# Patient Record
Sex: Female | Born: 1965 | Race: Black or African American | Hispanic: No | Smoking: Never smoker
Health system: Southern US, Community
[De-identification: ages and names within clinical notes are randomized; demographics above are authoritative.]

## PROBLEM LIST (undated history)

## (undated) DIAGNOSIS — N760 Acute vaginitis: Secondary | ICD-10-CM

## (undated) DIAGNOSIS — D649 Anemia, unspecified: Secondary | ICD-10-CM

## (undated) DIAGNOSIS — A770 Spotted fever due to Rickettsia rickettsii: Secondary | ICD-10-CM

## (undated) DIAGNOSIS — B9689 Other specified bacterial agents as the cause of diseases classified elsewhere: Secondary | ICD-10-CM

## (undated) DIAGNOSIS — R300 Dysuria: Secondary | ICD-10-CM

## (undated) DIAGNOSIS — Z9189 Other specified personal risk factors, not elsewhere classified: Secondary | ICD-10-CM

## (undated) DIAGNOSIS — N898 Other specified noninflammatory disorders of vagina: Secondary | ICD-10-CM

## (undated) DIAGNOSIS — D219 Benign neoplasm of connective and other soft tissue, unspecified: Secondary | ICD-10-CM

## (undated) DIAGNOSIS — A6920 Lyme disease, unspecified: Secondary | ICD-10-CM

## (undated) DIAGNOSIS — F419 Anxiety disorder, unspecified: Secondary | ICD-10-CM

## (undated) HISTORY — DX: Lyme disease, unspecified: A69.20

## (undated) HISTORY — DX: Dysuria: R30.0

## (undated) HISTORY — DX: Spotted fever due to Rickettsia rickettsii: A77.0

## (undated) HISTORY — DX: Anemia, unspecified: D64.9

## (undated) HISTORY — DX: Acute vaginitis: N76.0

## (undated) HISTORY — DX: Other specified personal risk factors, not elsewhere classified: Z91.89

## (undated) HISTORY — DX: Other specified noninflammatory disorders of vagina: N89.8

## (undated) HISTORY — DX: Other specified bacterial agents as the cause of diseases classified elsewhere: B96.89

## (undated) HISTORY — PX: TUBAL LIGATION: SHX77

## (undated) HISTORY — DX: Anxiety disorder, unspecified: F41.9

## (undated) HISTORY — DX: Benign neoplasm of connective and other soft tissue, unspecified: D21.9

---

## 2001-05-09 ENCOUNTER — Other Ambulatory Visit: Admission: RE | Admit: 2001-05-09 | Discharge: 2001-05-09 | Payer: Self-pay | Admitting: Obstetrics and Gynecology

## 2004-01-24 ENCOUNTER — Emergency Department (HOSPITAL_COMMUNITY): Admission: EM | Admit: 2004-01-24 | Discharge: 2004-01-24 | Payer: Self-pay | Admitting: Emergency Medicine

## 2004-12-19 ENCOUNTER — Ambulatory Visit (HOSPITAL_COMMUNITY): Admission: RE | Admit: 2004-12-19 | Discharge: 2004-12-19 | Payer: Self-pay | Admitting: Obstetrics and Gynecology

## 2005-02-20 ENCOUNTER — Ambulatory Visit: Payer: Self-pay | Admitting: Family Medicine

## 2005-02-27 ENCOUNTER — Ambulatory Visit (HOSPITAL_COMMUNITY): Admission: RE | Admit: 2005-02-27 | Discharge: 2005-02-27 | Payer: Self-pay | Admitting: Family Medicine

## 2005-05-18 ENCOUNTER — Ambulatory Visit: Payer: Self-pay | Admitting: Family Medicine

## 2005-07-13 ENCOUNTER — Ambulatory Visit: Payer: Self-pay | Admitting: Family Medicine

## 2005-08-15 ENCOUNTER — Ambulatory Visit: Payer: Self-pay | Admitting: Family Medicine

## 2006-01-12 ENCOUNTER — Ambulatory Visit: Payer: Self-pay | Admitting: Family Medicine

## 2006-09-27 ENCOUNTER — Ambulatory Visit: Payer: Self-pay | Admitting: Family Medicine

## 2006-09-27 LAB — CONVERTED CEMR LAB
ALT: 10 units/L (ref 0–35)
Albumin: 4.1 g/dL (ref 3.5–5.2)
Basophils Absolute: 0.1 10*3/uL (ref 0.0–0.1)
Bilirubin, Direct: 0.1 mg/dL (ref 0.0–0.3)
CO2: 25 meq/L (ref 19–32)
Calcium: 8.9 mg/dL (ref 8.4–10.5)
Chloride: 102 meq/L (ref 96–112)
Eosinophils Relative: 2 % (ref 0–5)
Glucose, Bld: 79 mg/dL (ref 70–99)
HCT: 41.2 % (ref 36.0–46.0)
HDL: 56 mg/dL (ref >39)
Indirect Bilirubin: 0.5 mg/dL (ref 0.0–0.9)
Lymphocytes Relative: 48 % — ABNORMAL HIGH (ref 12–46)
Lymphs Abs: 1.8 10*3/uL (ref 0.7–3.3)
MCHC: 30.1 g/dL (ref 30.0–36.0)
MCV: 69.6 fL — ABNORMAL LOW (ref 78.0–100.0)
Monocytes Relative: 10 % (ref 3–11)
Sodium: 138 meq/L (ref 135–145)
Total Bilirubin: 0.6 mg/dL (ref 0.3–1.2)
Total Protein: 7.8 g/dL (ref 6.0–8.3)
Triglycerides: 92 mg/dL (ref <150)

## 2006-11-07 ENCOUNTER — Ambulatory Visit: Payer: Self-pay | Admitting: Family Medicine

## 2007-07-23 ENCOUNTER — Other Ambulatory Visit: Admission: RE | Admit: 2007-07-23 | Discharge: 2007-07-23 | Payer: Self-pay | Admitting: Obstetrics and Gynecology

## 2008-03-18 DIAGNOSIS — N390 Urinary tract infection, site not specified: Secondary | ICD-10-CM | POA: Insufficient documentation

## 2008-07-22 ENCOUNTER — Other Ambulatory Visit: Admission: RE | Admit: 2008-07-22 | Discharge: 2008-07-22 | Payer: Self-pay | Admitting: Obstetrics and Gynecology

## 2008-09-02 ENCOUNTER — Encounter: Payer: Self-pay | Admitting: Family Medicine

## 2008-09-04 ENCOUNTER — Encounter: Payer: Self-pay | Admitting: Family Medicine

## 2008-11-24 ENCOUNTER — Telehealth: Payer: Self-pay | Admitting: Family Medicine

## 2008-12-07 ENCOUNTER — Encounter: Payer: Self-pay | Admitting: Family Medicine

## 2008-12-09 ENCOUNTER — Telehealth: Payer: Self-pay | Admitting: Family Medicine

## 2008-12-14 LAB — CONVERTED CEMR LAB: Vit D, 25-Hydroxy: 18 ng/mL — ABNORMAL LOW (ref 30–89)

## 2008-12-28 ENCOUNTER — Ambulatory Visit: Payer: Self-pay | Admitting: Family Medicine

## 2008-12-28 DIAGNOSIS — E876 Hypokalemia: Secondary | ICD-10-CM | POA: Insufficient documentation

## 2008-12-28 DIAGNOSIS — E559 Vitamin D deficiency, unspecified: Secondary | ICD-10-CM | POA: Insufficient documentation

## 2009-01-04 ENCOUNTER — Ambulatory Visit (HOSPITAL_COMMUNITY): Admission: RE | Admit: 2009-01-04 | Discharge: 2009-01-04 | Payer: Self-pay | Admitting: Family Medicine

## 2009-01-14 ENCOUNTER — Ambulatory Visit (HOSPITAL_COMMUNITY): Admission: RE | Admit: 2009-01-14 | Discharge: 2009-01-14 | Payer: Self-pay | Admitting: Family Medicine

## 2009-08-18 IMAGING — US US BREAST*L*
1 series · 2 of 2 positions shown · non-contrast
Comparison: Baseline mammogram 01/04/2009

CLINICAL DATA: Screening callback for questioned left breast mass
on baseline mammography

DIGITAL DIAGNOSTIC  LEFT  MAMMOGRAM  WITHOUT CAD AND LEFT BREAST
ULTRASOUND:

[Series 1: unknown · 0.08mm/px · 2 of 2 slices shown]
[im 1/2]
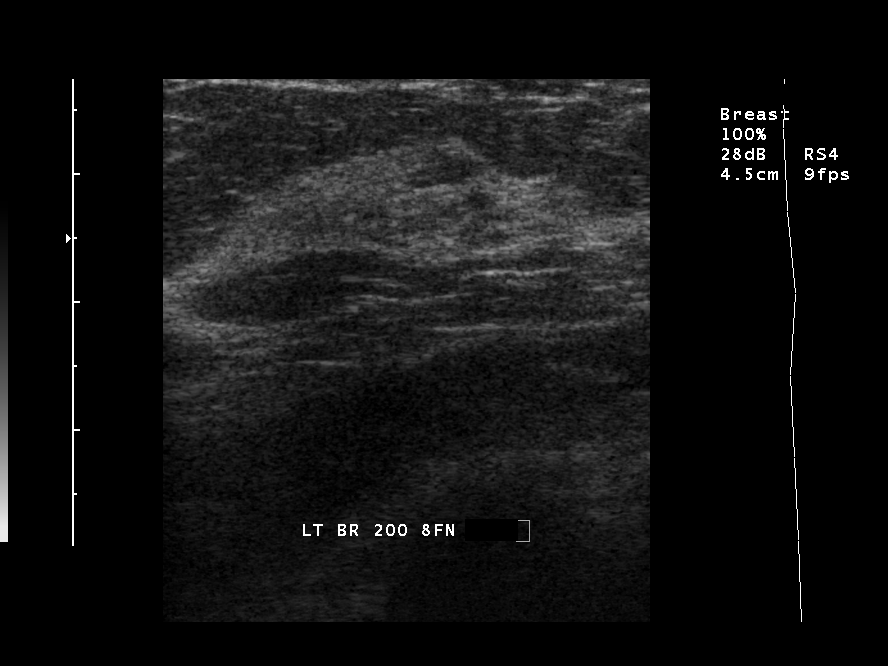
[im 2/2]
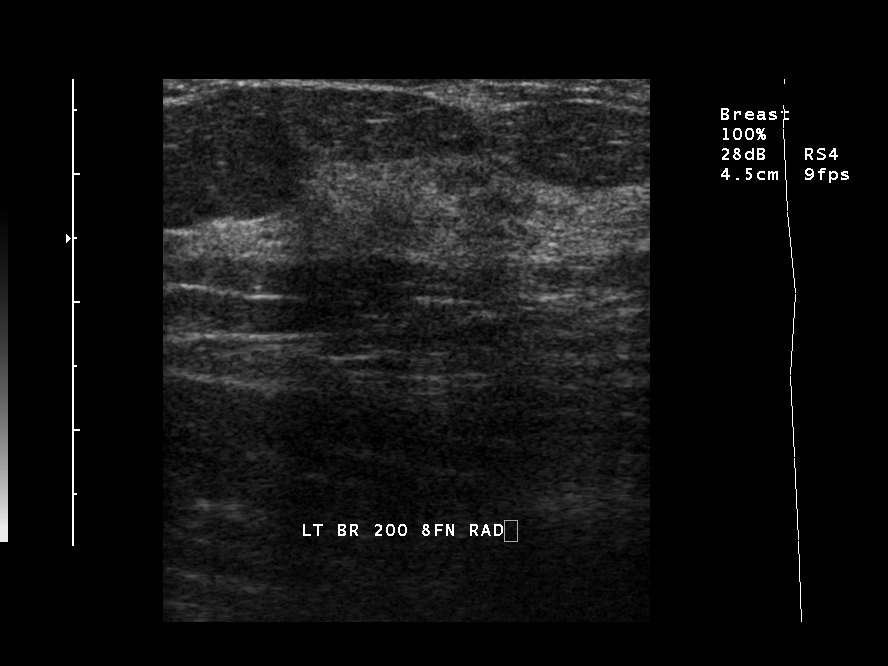

[2 of 2 positions shown; findings below may reference images not displayed]

FINDINGS: On additional views, the questioned abnormality in the
left upper outer quadrant changes conformation and demonstrates
pliability, suggestive of normal-appearing asymmetric glandular
tissue.

On physical exam, I palpate no abnormality in the left upper outer
quadrant.

Ultrasound is performed, showing an island of asymmetric normal
appearing glandular tissue in the left upper outer quadrant.  This
corresponds to the screening mammographic abnormality.
IMPRESSION: No evidence for malignancy in the left breast. Screening
mammography is recommended in one year. Findings and
recommendations discussed with the patient and provided in written
form at the time of the exam.

BI-RADS CATEGORY 1:  Negative.

Recommendation: Screening mammography is recommended in one year.

## 2010-09-19 ENCOUNTER — Other Ambulatory Visit
Admission: RE | Admit: 2010-09-19 | Discharge: 2010-09-19 | Payer: Self-pay | Source: Home / Self Care | Admitting: Obstetrics and Gynecology

## 2010-09-19 ENCOUNTER — Other Ambulatory Visit: Payer: Self-pay | Admitting: Adult Health

## 2010-09-26 ENCOUNTER — Ambulatory Visit (HOSPITAL_COMMUNITY)
Admission: RE | Admit: 2010-09-26 | Discharge: 2010-09-26 | Payer: Self-pay | Source: Home / Self Care | Attending: Obstetrics & Gynecology | Admitting: Obstetrics & Gynecology

## 2010-11-10 ENCOUNTER — Ambulatory Visit: Payer: Self-pay | Admitting: Family Medicine

## 2011-01-13 NOTE — H&P (Signed)
Dawn [name], Dawn [name] NO.:  [id number]   MEDICAL RECORD NO.:  [id number]          PATIENT TYPE:  AMB   LOCATION:                                FACILITY:  [facility]   PHYSICIAN:  Tilda Burrow, M.D. DATE OF BIRTH:  1966/05/20   DATE OF ADMISSION:  12/19/2004  DATE OF DISCHARGE:  LH                                HISTORY & PHYSICAL   ADMITTING DIAGNOSIS:  Desire for elective permanent sterilization.   HISTORY OF PRESENT ILLNESS:  This is for surgery at the [facility] on December 19, 2004.  This 45 year old female, gravida 2, para 2,  status post C-section x 2, is admitted for elective permanent sterilization.  Dawn [name] is currently on birth control pills, has been counseled over the  pros and cons of the desired procedure with [employer]  used as a visual guide with the patient explained in great detail of the  risks, benefits, complications, and technical procedure planned.  She  understands, questions are answered to the patient's satisfaction.  The  surgery is scheduled for second case on December 19, 2004.   PAST MEDICAL HISTORY:  Benign.   SURGICAL HISTORY:  Cesarean section x 2.   ALLERGIES:  None known.   GYNECOLOGIC HISTORY:  1.  Recent evaluation for abnormal Pap smear in 1983 and 2004, with      subsequent class I Pap smear in January 2006.  She had a slightly      anteflexed and slightly enlarged uterus which on exam today is      anteflexed, deviated to the left with possible small fibroid enlargement      in the lower uterine segments.  The cervix is visually normal.  Adnexa      are nontender.  Uterus is nontender.  2.  Menses are very light while on the pills.   PHYSICAL EXAMINATION:  GENERAL:  Reveals a healthy-appearing, light skin,  African American female, alert and oriented x 3.  VITAL SIGNS:  Weight 148, blood pressure 98/54.  HEENT:  Pupils are equal, round and reactive.  Extraocular movements intact.  NECK:  Supple.  Trachea is midline.  CHEST:  Clear to auscultation.  ABDOMEN:  Nontender. Abdomen does show a well healed Pfannenstiel incision  scar.  EXTERNAL GENITALIA:  Normal female.  VAGINAL:  Normal secretions.  Cervix large deviated to the patient's right.  The uterus is anteflexed and anteverted.  Adnexa are nontender without  masses.  Rectal support appears good.   PLAN:  Laparoscopic tubal sterilization  _fallope ring_  December 19, 2004.      JVF/MEDQ  D:  12/02/2004  T:  12/02/2004  Job:  [id number]   cc:   Tilda Burrow, M.D.  [address] [place]  [place] [address]  Fax: [phone]   [facility]  Fax: [phone]

## 2011-01-13 NOTE — Op Note (Signed)
NAMECARMENCITA, [name] NO.:  [id number]   MEDICAL RECORD NO.:  [id number]          PATIENT TYPE:  AMB   LOCATION:  DAY                           FACILITY:  [facility]   PHYSICIAN:  Tilda Burrow, M.D. DATE OF BIRTH:  08-Oct-1965   DATE OF PROCEDURE:  12/19/2004  DATE OF DISCHARGE:                                 OPERATIVE REPORT   PREOPERATIVE DIAGNOSIS:  Elective sterilization.   POSTOPERATIVE DIAGNOSIS:  Elective sterilization.   PROCEDURE:  Laparoscopic tubal sterilization with Falope rings.   SURGEON:  Christin Bach, M.D.   ASSISTANT:  __[place] CST-FA____   ANESTHESIA:  General.   COMPLICATIONS:  None.   FINDINGS:  Omental adhesions to anterior Cesarean section incision line, in  the midline, adhesion from bladder area to anterior abdominal wall, again  part of the Cesarean section line. Thickened tubes bilaterally requiring  care to successfully place the Falope ring.   DETAILS OF PROCEDURE:  The patient was taken to the operating room, prepped  and draped in the usual fashion for combined abdominal and vaginal  procedure. Hulka tenaculum was attached to the cervix for uterine  manipulation, and the bladder in and out drained of 300 cc of saline.  Attention was directed to the umbilicus where an infraumbilical vertical 1-  cm skin incision was made as well as a transverse suprapubic incision. A  Veress needle was used to through the umbilicus to introduce saline by water  droplet test, confirming intraperitoneal location, and then pneumoperitoneal  easily achieved under 9 mmHg pressure. Attention was then directed to the  left fallopian tube. We had to work around the long band of midline omental  adhesions to the anterior abdominal wall which did not appear taut and did  not appear to have any windows that could result in herniation, so the  adhesions themselves were left alone. I grasped the left fallopian tube at  its midportion, elevated it,  applied a Falope ring, then instilled dilute  Marcaine solution above and below the Falope ring on the left side. The  remaining ring dislodged enough to temporarily be over the grasping portion  of the ring applier, and this was photo documented in photo #1. The  redundant ring was able to be recovered and then proceeded to side #2. Side  #2 was identified, and photo 2 and photo 3 shows the omental adhesions.  Photo 4 shows the right fallopian tube which was somewhat edematous and  dilated at its mid portion. This photo was taken after we placed the ring on  the proximal portion of the tube with successful placement. We then  insufflated with dilute Marcaine solution into the incarcerated portion of  the tube.  Saline was instilled into the abdomen x200 cc; then deflation of the  abdomen, removal of laparoscopic equipment, and subcutaneous 4-0 Dexon  closure of skin incisions. The patient tolerated the procedure well and went  to recovery room in good condition. Sponge and needle counts correct.      JVF/MEDQ  D:  12/19/2004  T:  12/19/2004  Job:  [id number]

## 2011-01-30 ENCOUNTER — Ambulatory Visit (INDEPENDENT_AMBULATORY_CARE_PROVIDER_SITE_OTHER): Payer: 59 | Admitting: Family Medicine

## 2011-01-30 VITALS — BP 124/80 | Wt 160.1 lb

## 2011-01-30 DIAGNOSIS — R300 Dysuria: Secondary | ICD-10-CM

## 2011-01-30 LAB — POCT URINALYSIS DIPSTICK
Bilirubin, UA: NEGATIVE
Blood, UA: NEGATIVE
Glucose, UA: NEGATIVE
Leukocytes, UA: NEGATIVE
Nitrite, UA: NEGATIVE
Protein, UA: NEGATIVE
Spec Grav, UA: 1.02
Urobilinogen, UA: 1

## 2011-01-30 MED ORDER — CIPROFLOXACIN HCL 500 MG PO TABS: 500.0000 mg | ORAL_TABLET | Freq: Two times a day (BID) | ORAL | Status: AC

## 2011-01-30 NOTE — Progress Notes (Signed)
U/A was negative. Sent for culture and 3 days of Cipro called into pharmacy

## 2011-03-10 ENCOUNTER — Telehealth: Payer: Self-pay | Admitting: Family Medicine

## 2011-03-10 NOTE — Telephone Encounter (Signed)
Advised er or urgent care

## 2011-10-03 ENCOUNTER — Other Ambulatory Visit: Payer: Self-pay | Admitting: Adult Health

## 2011-10-03 ENCOUNTER — Other Ambulatory Visit (HOSPITAL_COMMUNITY)
Admission: RE | Admit: 2011-10-03 | Discharge: 2011-10-03 | Disposition: A | Discharge status: Home / Self Care | Payer: 59 | Source: Ambulatory Visit | Attending: Obstetrics and Gynecology | Admitting: Obstetrics and Gynecology

## 2011-10-03 DIAGNOSIS — Z01419 Encounter for gynecological examination (general) (routine) without abnormal findings: Secondary | ICD-10-CM | POA: Insufficient documentation

## 2011-10-25 ENCOUNTER — Encounter: Payer: Self-pay | Admitting: Orthopedic Surgery

## 2011-10-25 ENCOUNTER — Ambulatory Visit (INDEPENDENT_AMBULATORY_CARE_PROVIDER_SITE_OTHER): Payer: 59 | Admitting: Orthopedic Surgery

## 2011-10-25 VITALS — BP 110/68 | Ht 65.0 in | Wt 158.0 lb

## 2011-10-25 DIAGNOSIS — M189 Osteoarthritis of first carpometacarpal joint, unspecified: Secondary | ICD-10-CM

## 2011-10-25 DIAGNOSIS — M19049 Primary osteoarthritis, unspecified hand: Secondary | ICD-10-CM

## 2011-10-25 MED ORDER — DICLOFENAC POTASSIUM 50 MG PO TABS: 50.0000 mg | ORAL_TABLET | Freq: Two times a day (BID) | ORAL | Status: AC

## 2011-10-25 NOTE — Patient Instructions (Addendum)
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

## 2011-10-26 ENCOUNTER — Other Ambulatory Visit: Payer: Self-pay

## 2011-10-26 ENCOUNTER — Encounter: Payer: Self-pay | Admitting: Orthopedic Surgery

## 2011-10-26 DIAGNOSIS — M189 Osteoarthritis of first carpometacarpal joint, unspecified: Secondary | ICD-10-CM | POA: Insufficient documentation

## 2011-10-26 NOTE — Progress Notes (Signed)
Patient ID: Dawn [name], female   DOB: [birth year], 46 y.o.   MRN: [id number]  X-ray report  There were 27, 2013  X-rays LEFT hand  LEFT thumb pain  In general the bony contours are normal the joint spaces are preserved there is no surrounding osteophytes.  The bone quality looks normal.  There may be some subtle changes at the basilar joint of the thumb.  Impression normal x-ray Subjective:    Dawn [name] is a 46 y.o. female who presents for evaluation of left hand/finger pain. Onset was gradual, starting about 3 weeks ago. The pain is severe, worsens with movement, and is relieved by rest. There is no associated numbness, tingling in the hand . Evaluation to date: none. Treatment to date: nothing specific.  The following portions of the patient's history were reviewed and updated as appropriate: allergies, current medications, past family history, past medical history, past social history, past surgical history and problem list.  Review of Systems A comprehensive review of systems was negative except for:     Objective:    BP 110/68  Ht 5\' 5"  (1.651 m)  Wt 71.668 kg (158 lb)  BMI 26.29 kg/m2 Right hand:  normal exam, no swelling, tenderness, instability; ligaments intact, full ROM both hands, wrists, and finger joints  Left hand:   is tender over the basal joint of the thumb there is no crepitance there is a slight click to the grind test.  Range of motion is normal.  Stability is normal.  Pinch strength is normal.  The skin is normal.  The temperature and pulse of the extremity is normal.  Are no sensory deficits.   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 Questions answered. Reduction in offending activity discussed. NSAIDs per medication orders. Ryno Splint

## 2011-12-12 ENCOUNTER — Encounter: Payer: Self-pay | Admitting: Orthopedic Surgery

## 2011-12-12 ENCOUNTER — Ambulatory Visit (INDEPENDENT_AMBULATORY_CARE_PROVIDER_SITE_OTHER): Payer: 59 | Admitting: Orthopedic Surgery

## 2011-12-12 VITALS — BP 110/60 | Ht 65.0 in | Wt 158.0 lb

## 2011-12-12 DIAGNOSIS — M189 Osteoarthritis of first carpometacarpal joint, unspecified: Secondary | ICD-10-CM

## 2011-12-12 DIAGNOSIS — M19049 Primary osteoarthritis, unspecified hand: Secondary | ICD-10-CM

## 2011-12-12 NOTE — Patient Instructions (Signed)
Continue same treatment   F/u in 3 months

## 2011-12-12 NOTE — Progress Notes (Signed)
  Subjective:    Patient ID: Dawn [name], female    DOB: [birth year], 46 y.o.   MRN: [id number]  HPI Chief Complaint  Patient presents with  . Follow-up    6 week follow up following [name]   LEFT thumb basilar joint arthritis.  Treatment splint, and topical anti-inflammatory cream.  The patient reports that her pain is much better and was even better when she was working while she was on vacation. She comes in for reevaluation.    Review of Systems No new findings and her review of systems    Objective:   Physical Exam She is awake, alert, and oriented x3. Mood and affect are normal. On inspection she has mild tenderness in the palm at the base of the joint. Grind test is negative. She is range of motion of the thumb and index finger. No instability. Normal pinch strength. Skin is intact       Assessment & Plan:    Basilar joint arthritis of the thumb, controlled  Continue current treatment  Followup in 3 months if needed

## 2012-03-12 ENCOUNTER — Ambulatory Visit: Payer: 59 | Admitting: Orthopedic Surgery

## 2014-01-21 ENCOUNTER — Ambulatory Visit (INDEPENDENT_AMBULATORY_CARE_PROVIDER_SITE_OTHER): Payer: 59 | Admitting: Adult Health

## 2014-01-21 ENCOUNTER — Encounter: Payer: Self-pay | Admitting: Adult Health

## 2014-01-21 VITALS — BP 100/68 | Ht 65.0 in | Wt 157.0 lb

## 2014-01-21 DIAGNOSIS — B9689 Other specified bacterial agents as the cause of diseases classified elsewhere: Secondary | ICD-10-CM

## 2014-01-21 DIAGNOSIS — A499 Bacterial infection, unspecified: Secondary | ICD-10-CM

## 2014-01-21 DIAGNOSIS — N760 Acute vaginitis: Secondary | ICD-10-CM

## 2014-01-21 DIAGNOSIS — N898 Other specified noninflammatory disorders of vagina: Secondary | ICD-10-CM | POA: Insufficient documentation

## 2014-01-21 HISTORY — DX: Other specified bacterial agents as the cause of diseases classified elsewhere: N76.0

## 2014-01-21 HISTORY — DX: Other specified noninflammatory disorders of vagina: N89.8

## 2014-01-21 HISTORY — DX: Other specified bacterial agents as the cause of diseases classified elsewhere: B96.89

## 2014-01-21 LAB — POCT WET PREP (WET MOUNT): WBC WET PREP: POSITIVE

## 2014-01-21 MED ORDER — METRONIDAZOLE 500 MG PO TABS: 500.0000 mg | ORAL_TABLET | Freq: Two times a day (BID) | ORAL | Status: DC

## 2014-01-21 NOTE — Patient Instructions (Signed)
Bacterial Vaginosis Bacterial vaginosis is a vaginal infection that occurs when the normal balance of bacteria in the vagina is disrupted. It results from an overgrowth of certain bacteria. This is the most common vaginal infection in women of childbearing age. Treatment is important to prevent complications, especially in pregnant women, as it can cause a premature delivery. CAUSES  Bacterial vaginosis is caused by an increase in harmful bacteria that are normally present in smaller amounts in the vagina. Several different kinds of bacteria can cause bacterial vaginosis. However, the reason that the condition develops is not fully understood. RISK FACTORS Certain activities or behaviors can put you at an increased risk of developing bacterial vaginosis, including:  Having a new sex partner or multiple sex partners.  Douching.  Using an intrauterine device (IUD) for contraception. Women do not get bacterial vaginosis from toilet seats, bedding, swimming pools, or contact with objects around them. SIGNS AND SYMPTOMS  Some women with bacterial vaginosis have no signs or symptoms. Common symptoms include:  Grey vaginal discharge.  A fishlike odor with discharge, especially after sexual intercourse.  Itching or burning of the vagina and vulva.  Burning or pain with urination. DIAGNOSIS  Your health care provider will take a medical history and examine the vagina for signs of bacterial vaginosis. A sample of vaginal fluid may be taken. Your health care provider will look at this sample under a microscope to check for bacteria and abnormal cells. A vaginal pH test may also be done.  TREATMENT  Bacterial vaginosis may be treated with antibiotic medicines. These may be given in the form of a pill or a vaginal cream. A second round of antibiotics may be prescribed if the condition comes back after treatment.  HOME CARE INSTRUCTIONS   Only take over-the-counter or prescription medicines as  directed by your health care provider.  If antibiotic medicine was prescribed, take it as directed. Make sure you finish it even if you start to feel better.  Do not have sex until treatment is completed.  Tell all sexual partners that you have a vaginal infection. They should see their health care provider and be treated if they have problems, such as a mild rash or itching.  Practice safe sex by using condoms and only having one sex partner. SEEK MEDICAL CARE IF:   Your symptoms are not improving after 3 days of treatment.  You have increased discharge or pain.  You have a fever. MAKE SURE YOU:   Understand these instructions.  Will watch your condition.  Will get help right away if you are not doing well or get worse. FOR MORE INFORMATION  Centers for Disease Control and Prevention, Division of STD Prevention: AppraiserFraud.fi American Sexual Health Association (ASHA): www.ashastd.org  Document Released: 08/14/2005 Document Revised: 06/04/2013 Document Reviewed: 03/26/2013 [facility] Patient Information 2014 [place]. Try REPHRESH Follow up prn

## 2014-01-21 NOTE — Progress Notes (Signed)
Subjective:     Patient ID: [name], female   DOB: 13-Oct-1965, 48 y.o.   MRN: [id number]  HPI Deavion is a 48 year old black female in complaining of vaginal discharge with odor and feels itchy.  Review of Systems See HPI Reviewed past medical,surgical, social and family history. Reviewed medications and allergies.     Objective:   Physical Exam BP 100/68  Ht 5\' 5"  (1.651 m)  Wt 157 lb (71.215 kg)  BMI 26.13 kg/m2  LMP 12/31/2013   Skin warm and dry.Pelvic: external genitalia is normal in appearance, vagina: white discharge with odor, cervix:smooth and bulbous, uterus: normal size, shape and contour, non tender, no masses felt, adnexa: no masses or tenderness noted. Wet prep: + for clue cells and +WBCs.  Assessment:     Vaginal discharge BV    Plan:    Try REPHRESH Rx flagyl 500 mg 1 bid x 7 days, 1 refill, no alcohol, review handout on BV No douching,no shower gels or thongs Follow up prn

## 2014-06-29 ENCOUNTER — Encounter: Payer: Self-pay | Admitting: Adult Health

## 2014-08-17 ENCOUNTER — Encounter: Payer: Self-pay | Admitting: Adult Health

## 2014-08-17 ENCOUNTER — Ambulatory Visit (INDEPENDENT_AMBULATORY_CARE_PROVIDER_SITE_OTHER): Payer: 59 | Admitting: Adult Health

## 2014-08-17 VITALS — BP 94/68 | Ht 65.0 in | Wt 155.5 lb

## 2014-08-17 DIAGNOSIS — A499 Bacterial infection, unspecified: Secondary | ICD-10-CM

## 2014-08-17 DIAGNOSIS — B9689 Other specified bacterial agents as the cause of diseases classified elsewhere: Secondary | ICD-10-CM

## 2014-08-17 DIAGNOSIS — N760 Acute vaginitis: Secondary | ICD-10-CM

## 2014-08-17 DIAGNOSIS — N390 Urinary tract infection, site not specified: Secondary | ICD-10-CM

## 2014-08-17 DIAGNOSIS — N898 Other specified noninflammatory disorders of vagina: Secondary | ICD-10-CM

## 2014-08-17 DIAGNOSIS — R300 Dysuria: Secondary | ICD-10-CM | POA: Insufficient documentation

## 2014-08-17 HISTORY — DX: Dysuria: R30.0

## 2014-08-17 LAB — POCT URINALYSIS DIPSTICK
Blood, UA: NEGATIVE
LEUKOCYTES UA: NEGATIVE
Nitrite, UA: POSITIVE
Protein, UA: NEGATIVE

## 2014-08-17 LAB — POCT WET PREP (WET MOUNT): WBC, Wet Prep HPF POC: POSITIVE

## 2014-08-17 MED ORDER — NITROFURANTOIN MONOHYD MACRO 100 MG PO CAPS: 100.0000 mg | ORAL_CAPSULE | Freq: Two times a day (BID) | ORAL | Status: DC

## 2014-08-17 MED ORDER — METRONIDAZOLE 500 MG PO TABS: 500.0000 mg | ORAL_TABLET | Freq: Two times a day (BID) | ORAL | Status: DC

## 2014-08-17 NOTE — Progress Notes (Signed)
Subjective:     Patient ID: [name], female   DOB: 1966/01/19, 48 y.o.   MRN: [id number]  HPI Claudeen is a 48 year old black female in complaining of vaginal discharge with odor and burning with urination.  Review of Systems See HPI Reviewed past medical,surgical, social and family history. Reviewed medications and allergies.     Objective:   Physical Exam BP 94/68 mmHg  Ht 5\' 5"  (1.651 m)  Wt 155 lb 8 oz (70.534 kg)  BMI 25.88 kg/m2  LMP 07/31/2014   urine + nitrates, Skin warm and dry.Pelvic: external genitalia is normal in appearance, vagina: white discharge with odor, red side walls, cervix:smooth and bulbous, uterus: normal size, shape and contour, non tender, no masses felt, adnexa: no masses or tenderness noted. Wet prep: + for clue cells and +WBCs. GC/CHL obtained.   Assessment:     Vaginal discharge BV Burning with urination   UTI  Plan:     Rx flagyl 500 mg 1 bid x 7 days, no alcohol, review handout on BV   Rx macrobid 1 bid x 7 days #14,review handout on UTI Push water   Follow up prn UA C&S and GC/CHL sent

## 2014-08-17 NOTE — Patient Instructions (Signed)
Bacterial Vaginosis Bacterial vaginosis is a vaginal infection that occurs when the normal balance of bacteria in the vagina is disrupted. It results from an overgrowth of certain bacteria. This is the most common vaginal infection in women of childbearing age. Treatment is important to prevent complications, especially in pregnant women, as it can cause a premature delivery. CAUSES  Bacterial vaginosis is caused by an increase in harmful bacteria that are normally present in smaller amounts in the vagina. Several different kinds of bacteria can cause bacterial vaginosis. However, the reason that the condition develops is not fully understood. RISK FACTORS Certain activities or behaviors can put you at an increased risk of developing bacterial vaginosis, including:  Having a new sex partner or multiple sex partners.  Douching.  Using an intrauterine device (IUD) for contraception. Women do not get bacterial vaginosis from toilet seats, bedding, swimming pools, or contact with objects around them. SIGNS AND SYMPTOMS  Some women with bacterial vaginosis have no signs or symptoms. Common symptoms include:  Grey vaginal discharge.  A fishlike odor with discharge, especially after sexual intercourse.  Itching or burning of the vagina and vulva.  Burning or pain with urination. DIAGNOSIS  Your health care provider will take a medical history and examine the vagina for signs of bacterial vaginosis. A sample of vaginal fluid may be taken. Your health care provider will look at this sample under a microscope to check for bacteria and abnormal cells. A vaginal pH test may also be done.  TREATMENT  Bacterial vaginosis may be treated with antibiotic medicines. These may be given in the form of a pill or a vaginal cream. A second round of antibiotics may be prescribed if the condition comes back after treatment.  HOME CARE INSTRUCTIONS   Only take over-the-counter or prescription medicines as  directed by your health care provider.  If antibiotic medicine was prescribed, take it as directed. Make sure you finish it even if you start to feel better.  Do not have sex until treatment is completed.  Tell all sexual partners that you have a vaginal infection. They should see their health care provider and be treated if they have problems, such as a mild rash or itching.  Practice safe sex by using condoms and only having one sex partner. SEEK MEDICAL CARE IF:   Your symptoms are not improving after 3 days of treatment.  You have increased discharge or pain.  You have a fever. MAKE SURE YOU:   Understand these instructions.  Will watch your condition.  Will get help right away if you are not doing well or get worse. FOR MORE INFORMATION  Centers for Disease Control and Prevention, Division of STD Prevention: AppraiserFraud.fi American Sexual Health Association (ASHA): www.ashastd.org  Document Released: 08/14/2005 Document Revised: 06/04/2013 Document Reviewed: 03/26/2013 [facility] Patient Information 2015 [place], [place]. This information is not intended to replace advice given to you by your health care provider. Make sure you discuss any questions you have with your health care provider. Urinary Tract Infection Urinary tract infections (UTIs) can develop anywhere along your urinary tract. Your urinary tract is your body's drainage system for removing wastes and extra water. Your urinary tract includes two kidneys, two ureters, a bladder, and a urethra. Your kidneys are a pair of bean-shaped organs. Each kidney is about the size of your fist. They are located below your ribs, one on each side of your spine. CAUSES Infections are caused by microbes, which are microscopic organisms, including fungi, viruses, and  bacteria. These organisms are so small that they can only be seen through a microscope. Bacteria are the microbes that most commonly cause UTIs. SYMPTOMS  Symptoms of UTIs  may vary by age and gender of the patient and by the location of the infection. Symptoms in young women typically include a frequent and intense urge to urinate and a painful, burning feeling in the bladder or urethra during urination. Older women and men are more likely to be tired, shaky, and weak and have muscle aches and abdominal pain. A fever may mean the infection is in your kidneys. Other symptoms of a kidney infection include pain in your back or sides below the ribs, nausea, and vomiting. DIAGNOSIS To diagnose a UTI, your caregiver will ask you about your symptoms. Your caregiver also will ask to provide a urine sample. The urine sample will be tested for bacteria and white blood cells. White blood cells are made by your body to help fight infection. TREATMENT  Typically, UTIs can be treated with medication. Because most UTIs are caused by a bacterial infection, they usually can be treated with the use of antibiotics. The choice of antibiotic and length of treatment depend on your symptoms and the type of bacteria causing your infection. HOME CARE INSTRUCTIONS  If you were prescribed antibiotics, take them exactly as your caregiver instructs you. Finish the medication even if you feel better after you have only taken some of the medication.  Drink enough water and fluids to keep your urine clear or pale yellow.  Avoid caffeine, tea, and carbonated beverages. They tend to irritate your bladder.  Empty your bladder often. Avoid holding urine for long periods of time.  Empty your bladder before and after sexual intercourse.  After a bowel movement, women should cleanse from front to back. Use each tissue only once. SEEK MEDICAL CARE IF:   You have back pain.  You develop a fever.  Your symptoms do not begin to resolve within 3 days. SEEK IMMEDIATE MEDICAL CARE IF:   You have severe back pain or lower abdominal pain.  You develop chills.  You have nausea or vomiting.  You have  continued burning or discomfort with urination. MAKE SURE YOU:   Understand these instructions.  Will watch your condition.  Will get help right away if you are not doing well or get worse. Document Released: 05/24/2005 Document Revised: 02/13/2012 Document Reviewed: 09/22/2011 [facility] Patient Information 2015 [place], [place]. This information is not intended to replace advice given to you by your health care provider. Make sure you discuss any questions you have with your health care provider. Push fluids  No alcohol with flagyl Follow up

## 2014-08-18 LAB — URINALYSIS
Bilirubin Urine: NEGATIVE
GLUCOSE, UA: NEGATIVE mg/dL
HGB URINE DIPSTICK: NEGATIVE
KETONES UR: NEGATIVE mg/dL
Leukocytes, UA: NEGATIVE
NITRITE: POSITIVE — AB
PH: 6.5 (ref 5.0–8.0)
PROTEIN: NEGATIVE mg/dL
Specific Gravity, Urine: 1.009 (ref 1.005–1.030)
Urobilinogen, UA: 0.2 mg/dL (ref 0.0–1.0)

## 2014-08-18 LAB — GC/CHLAMYDIA PROBE AMP
CT Probe RNA: NEGATIVE
GC Probe RNA: NEGATIVE

## 2014-08-19 LAB — URINE CULTURE

## 2015-11-03 ENCOUNTER — Ambulatory Visit (INDEPENDENT_AMBULATORY_CARE_PROVIDER_SITE_OTHER): Payer: Commercial Managed Care - HMO | Admitting: Adult Health

## 2015-11-03 ENCOUNTER — Encounter: Payer: Self-pay | Admitting: Adult Health

## 2015-11-03 VITALS — BP 100/60 | HR 76 | Ht 65.0 in | Wt 158.0 lb

## 2015-11-03 DIAGNOSIS — L298 Other pruritus: Secondary | ICD-10-CM | POA: Diagnosis not present

## 2015-11-03 DIAGNOSIS — N898 Other specified noninflammatory disorders of vagina: Secondary | ICD-10-CM | POA: Diagnosis not present

## 2015-11-03 DIAGNOSIS — B9689 Other specified bacterial agents as the cause of diseases classified elsewhere: Secondary | ICD-10-CM

## 2015-11-03 DIAGNOSIS — N760 Acute vaginitis: Secondary | ICD-10-CM | POA: Diagnosis not present

## 2015-11-03 DIAGNOSIS — A499 Bacterial infection, unspecified: Secondary | ICD-10-CM

## 2015-11-03 HISTORY — DX: Other specified noninflammatory disorders of vagina: N89.8

## 2015-11-03 LAB — POCT WET PREP (WET MOUNT)
CLUE CELLS WET PREP WHIFF POC: POSITIVE
WBC, Wet Prep HPF POC: POSITIVE

## 2015-11-03 MED ORDER — METRONIDAZOLE 500 MG PO TABS: 500.0000 mg | ORAL_TABLET | Freq: Two times a day (BID) | ORAL | Status: DC

## 2015-11-03 NOTE — Progress Notes (Signed)
Subjective:     Patient ID: [name], female   DOB: Nov 17, 1965, 50 y.o.   MRN: [id number]  HPI Dawn [name] is a 50 year old black female in complaining of vaginal discharge and itching for about 5 days, denies any new products or partners.  Review of Systems Patient denies any headaches, hearing loss, fatigue, blurred vision, shortness of breath, chest pain, abdominal pain, problems with bowel movements, urination, or intercourse. No joint pain or mood swings.See HPI for positives. Reviewed past medical,surgical, social and family history. Reviewed medications and allergies.     Objective:   Physical Exam BP 100/60 mmHg  Pulse 76  Ht 5\' 5"  (1.651 m)  Wt 158 lb (71.668 kg)  BMI 26.29 kg/m2  LMP 10/18/2015 Skin warm and dry.Pelvic: external genitalia is normal in appearance no lesions, vagina: white discharge with odor,urethra has no lesions or masses noted, cervix:smooth and bulbous, uterus: normal size, shape and contour, non tender, no masses felt, adnexa: no masses or tenderness noted. Bladder is non tender and no masses felt. Wet prep: + for clue cells and +WBCs.    Assessment:     Vaginal discharge Vaginal itching BV    Plan:      Rx flagyl 500 mg 1 bid x 7 days, no alcohol, review handout on BV   Follow up prn

## 2015-11-03 NOTE — Patient Instructions (Signed)
Bacterial Vaginosis Bacterial vaginosis is a vaginal infection that occurs when the normal balance of bacteria in the vagina is disrupted. It results from an overgrowth of certain bacteria. This is the most common vaginal infection in women of childbearing age. Treatment is important to prevent complications, especially in pregnant women, as it can cause a premature delivery. CAUSES  Bacterial vaginosis is caused by an increase in harmful bacteria that are normally present in smaller amounts in the vagina. Several different kinds of bacteria can cause bacterial vaginosis. However, the reason that the condition develops is not fully understood. RISK FACTORS Certain activities or behaviors can put you at an increased risk of developing bacterial vaginosis, including:  Having a new sex partner or multiple sex partners.  Douching.  Using an intrauterine device (IUD) for contraception. Women do not get bacterial vaginosis from toilet seats, bedding, swimming pools, or contact with objects around them. SIGNS AND SYMPTOMS  Some women with bacterial vaginosis have no signs or symptoms. Common symptoms include:  Grey vaginal discharge.  A fishlike odor with discharge, especially after sexual intercourse.  Itching or burning of the vagina and vulva.  Burning or pain with urination. DIAGNOSIS  Your health care provider will take a medical history and examine the vagina for signs of bacterial vaginosis. A sample of vaginal fluid may be taken. Your health care provider will look at this sample under a microscope to check for bacteria and abnormal cells. A vaginal pH test may also be done.  TREATMENT  Bacterial vaginosis may be treated with antibiotic medicines. These may be given in the form of a pill or a vaginal cream. A second round of antibiotics may be prescribed if the condition comes back after treatment. Because bacterial vaginosis increases your risk for sexually transmitted diseases, getting  treated can help reduce your risk for chlamydia, gonorrhea, HIV, and herpes. HOME CARE INSTRUCTIONS   Only take over-the-counter or prescription medicines as directed by your health care provider.  If antibiotic medicine was prescribed, take it as directed. Make sure you finish it even if you start to feel better.  Tell all sexual partners that you have a vaginal infection. They should see their health care provider and be treated if they have problems, such as a mild rash or itching.  During treatment, it is important that you follow these instructions:  Avoid sexual activity or use condoms correctly.  Do not douche.  Avoid alcohol as directed by your health care provider.  Avoid breastfeeding as directed by your health care provider. SEEK MEDICAL CARE IF:   Your symptoms are not improving after 3 days of treatment.  You have increased discharge or pain.  You have a fever. MAKE SURE YOU:   Understand these instructions.  Will watch your condition.  Will get help right away if you are not doing well or get worse. FOR MORE INFORMATION  Centers for Disease Control and Prevention, Division of STD Prevention: AppraiserFraud.fi American Sexual Health Association (ASHA): www.ashastd.org    This information is not intended to replace advice given to you by your health care provider. Make sure you discuss any questions you have with your health care provider.   Document Released: 08/14/2005 Document Revised: 09/04/2014 Document Reviewed: 03/26/2013 Elsevier Interactive Patient Education 2016 [place]. No alcohol Follow up prn

## 2016-07-31 ENCOUNTER — Encounter: Payer: Self-pay | Admitting: Adult Health

## 2016-07-31 ENCOUNTER — Ambulatory Visit (INDEPENDENT_AMBULATORY_CARE_PROVIDER_SITE_OTHER): Payer: Commercial Managed Care - HMO | Admitting: Adult Health

## 2016-07-31 ENCOUNTER — Encounter (INDEPENDENT_AMBULATORY_CARE_PROVIDER_SITE_OTHER): Payer: Self-pay

## 2016-07-31 VITALS — BP 90/60 | HR 64 | Ht 65.0 in | Wt 161.0 lb

## 2016-07-31 DIAGNOSIS — N898 Other specified noninflammatory disorders of vagina: Secondary | ICD-10-CM

## 2016-07-31 DIAGNOSIS — L298 Other pruritus: Secondary | ICD-10-CM

## 2016-07-31 DIAGNOSIS — B9689 Other specified bacterial agents as the cause of diseases classified elsewhere: Secondary | ICD-10-CM

## 2016-07-31 DIAGNOSIS — N760 Acute vaginitis: Secondary | ICD-10-CM

## 2016-07-31 LAB — POCT WET PREP (WET MOUNT): CLUE CELLS WET PREP WHIFF POC: POSITIVE

## 2016-07-31 MED ORDER — METRONIDAZOLE 0.75 % VA GEL: 1.0000 | Freq: Two times a day (BID) | VAGINAL | 1 refills | Status: DC

## 2016-07-31 NOTE — Progress Notes (Signed)
Subjective:     Patient ID: Dawn [name], female   DOB: [birth year], 50 y.o.   MRN: [id number]  HPI Dawn [name] is a 50 year old black female in complaining of vaginal discharge with odor and itching for about a month, no new partners. Has had similar symptoms in past.   Review of Systems +vaginal discharge +vaginal itching  +vaginal odor Reviewed past medical,surgical, social and family history. Reviewed medications and allergies.     Objective:   Physical Exam BP 90/60 (BP Location: Left Arm, Patient Position: Sitting, Cuff Size: Normal)   Pulse 64   Ht 5\' 5"  (1.651 m)   Wt 161 lb (73 kg)   LMP 07/10/2016 (Approximate)   BMI 26.79 kg/m   PHQ 2 score 0. Skin warm and dry.Pelvic: external genitalia is normal in appearance no lesions, vagina: white discharge with odor,urethra has no lesions or masses noted, cervix:smooth and bulbous, uterus: normal size, shape and contour, non tender, no masses felt, adnexa: no masses or tenderness noted. Bladder is non tender and no masses felt. Wet prep: + for clue cells and +WBCs.   Reinforced no tub baths, douching or thongs.  Assessment:     1. Vaginal discharge   2. Vaginal itching   3. Vaginal odor   4. BV (bacterial vaginosis)       Plan:     Meds ordered this encounter  Medications  . metroNIDAZOLE (METROGEL VAGINAL) 0.75 % vaginal gel    Sig: Place 1 Applicatorful vaginally 2 (two) times daily.    Dispense:  70 g    Refill:  1    Order Specific Question:   Supervising Provider    Answer:   Tania Ade H [[id number]]  Follow up prn

## 2016-07-31 NOTE — Patient Instructions (Signed)
Follow up prn Bacterial Vaginosis Bacterial vaginosis is a vaginal infection that occurs when the normal balance of bacteria in the vagina is disrupted. It results from an overgrowth of certain bacteria. This is the most common vaginal infection among women ages 70-44. Because bacterial vaginosis increases your risk for STIs (sexually transmitted infections), getting treated can help reduce your risk for chlamydia, gonorrhea, herpes, and HIV (human immunodeficiency virus). Treatment is also important for preventing complications in pregnant women, because this condition can cause an early (premature) delivery. What are the causes? This condition is caused by an increase in harmful bacteria that are normally present in small amounts in the vagina. However, the reason that the condition develops is not fully understood. What increases the risk? The following factors may make you more likely to develop this condition:  Having a new sexual partner or multiple sexual partners.  Having unprotected sex.  Douching.  Having an intrauterine device (IUD).  Smoking.  Drug and alcohol abuse.  Taking certain antibiotic medicines.  Being pregnant. You cannot get bacterial vaginosis from toilet seats, bedding, swimming pools, or contact with objects around you. What are the signs or symptoms? Symptoms of this condition include:  Grey or white vaginal discharge. The discharge can also be watery or foamy.  A fish-like odor with discharge, especially after sexual intercourse or during menstruation.  Itching in and around the vagina.  Burning or pain with urination. Some women with bacterial vaginosis have no signs or symptoms. How is this diagnosed? This condition is diagnosed based on:  Your medical history.  A physical exam of the vagina.  Testing a sample of vaginal fluid under a microscope to look for a large amount of bad bacteria or abnormal cells. Your health care provider may use a  cotton swab or a small wooden spatula to collect the sample. How is this treated? This condition is treated with antibiotics. These may be given as a pill, a vaginal cream, or a medicine that is put into the vagina (suppository). If the condition comes back after treatment, a second round of antibiotics may be needed. Follow these instructions at home: Medicines  Take over-the-counter and prescription medicines only as told by your health care provider.  Take or use your antibiotic as told by your health care provider. Do not stop taking or using the antibiotic even if you start to feel better. General instructions  If you have a female sexual partner, tell her that you have a vaginal infection. She should see her health care provider and be treated if she has symptoms. If you have a female sexual partner, he does not need treatment.  During treatment:  Avoid sexual activity until you finish treatment.  Do not douche.  Avoid alcohol as directed by your health care provider.  Avoid breastfeeding as directed by your health care provider.  Drink enough water and fluids to keep your urine clear or pale yellow.  Keep the area around your vagina and rectum clean.  Wash the area daily with warm water.  Wipe yourself from front to back after using the toilet.  Keep all follow-up visits as told by your health care provider. This is important. How is this prevented?  Do not douche.  Wash the outside of your vagina with warm water only.  Use protection when having sex. This includes latex condoms and dental dams.  Limit how many sexual partners you have. To help prevent bacterial vaginosis, it is best to have sex with just  one partner (monogamous).  Make sure you and your sexual partner are tested for STIs.  Wear cotton or cotton-lined underwear.  Avoid wearing tight pants and pantyhose, especially during summer.  Limit the amount of alcohol that you drink.  Do not use any  products that contain nicotine or tobacco, such as cigarettes and e-cigarettes. If you need help quitting, ask your health care provider.  Do not use illegal drugs. Where to find more information:  Centers for Disease Control and Prevention: AppraiserFraud.fi  American Sexual Health Association (ASHA): www.ashastd.org  U.S. Department of Health and Financial controller, Office on Women's Health: DustingSprays.pl or SecuritiesCard.it Contact a health care provider if:  Your symptoms do not improve, even after treatment.  You have more discharge or pain when urinating.  You have a fever.  You have pain in your abdomen.  You have pain during sex.  You have vaginal bleeding between periods. Summary  Bacterial vaginosis is a vaginal infection that occurs when the normal balance of bacteria in the vagina is disrupted.  Because bacterial vaginosis increases your risk for STIs (sexually transmitted infections), getting treated can help reduce your risk for chlamydia, gonorrhea, herpes, and HIV (human immunodeficiency virus). Treatment is also important for preventing complications in pregnant women, because the condition can cause an early (premature) delivery.  This condition is treated with antibiotic medicines. These may be given as a pill, a vaginal cream, or a medicine that is put into the vagina (suppository). This information is not intended to replace advice given to you by your health care provider. Make sure you discuss any questions you have with your health care provider. Document Released: 08/14/2005 Document Revised: 04/29/2016 Document Reviewed: 04/29/2016 Elsevier Interactive Patient Education  2017 [employer].

## 2017-01-08 ENCOUNTER — Encounter: Payer: Self-pay | Admitting: Family Medicine

## 2017-01-08 ENCOUNTER — Ambulatory Visit (INDEPENDENT_AMBULATORY_CARE_PROVIDER_SITE_OTHER): Payer: 59 | Admitting: Family Medicine

## 2017-01-08 VITALS — BP 110/70 | HR 75 | Resp 16 | Ht 65.0 in | Wt 152.0 lb

## 2017-01-08 DIAGNOSIS — Z1159 Encounter for screening for other viral diseases: Secondary | ICD-10-CM | POA: Diagnosis not present

## 2017-01-08 DIAGNOSIS — Z1231 Encounter for screening mammogram for malignant neoplasm of breast: Secondary | ICD-10-CM

## 2017-01-08 DIAGNOSIS — Z01419 Encounter for gynecological examination (general) (routine) without abnormal findings: Secondary | ICD-10-CM | POA: Diagnosis not present

## 2017-01-08 DIAGNOSIS — F4321 Adjustment disorder with depressed mood: Secondary | ICD-10-CM

## 2017-01-08 DIAGNOSIS — Z008 Encounter for other general examination: Secondary | ICD-10-CM

## 2017-01-08 DIAGNOSIS — Z1239 Encounter for other screening for malignant neoplasm of breast: Secondary | ICD-10-CM

## 2017-01-08 DIAGNOSIS — Z23 Encounter for immunization: Secondary | ICD-10-CM

## 2017-01-08 DIAGNOSIS — F4320 Adjustment disorder, unspecified: Secondary | ICD-10-CM

## 2017-01-08 DIAGNOSIS — Z0000 Encounter for general adult medical examination without abnormal findings: Secondary | ICD-10-CM | POA: Diagnosis not present

## 2017-01-08 NOTE — Progress Notes (Signed)
   Dawn [name]     MRN: [id number]      DOB: July 21, 1966   HPI Dawn [name] is here to re establish care after approximately 12 years. She is generally healthy and is on no chronic medication. Unfortunately, she unexpectedly lost the love of her life which she has only very good memories of in January 2018, they would have been married for 30 years next year. With the support of family in particular , her faith, and the great memories that hse has , and her own inner strength, she is doing remarkably well. She still has challenge as far as re establishing a healthy sleep pattern and also regular exercise which she used to do with her husband Her PHQ 9 scores in at 6 today Her mother in law is in a support bereavement group and I encourage her to go to "check it out" also Her cancer screening is behind as is her blood work and also immunization and all of these will be addressed   ROS Denies recent fever or chills. Denies sinus pressure, nasal congestion, ear pain or sore throat. Denies chest congestion, productive cough or wheezing. Denies chest pains, palpitations and leg swelling Denies abdominal pain, nausea, vomiting,diarrhea or constipation.   Denies dysuria, frequency, hesitancy or incontinence. Denies joint pain, swelling and limitation in mobility. Denies headaches, seizures, numbness, or tingling.  Denies skin break down or rash.   PE  BP 110/70   Pulse 75   Resp 16   Ht 5\' 5"  (1.651 m)   Wt 152 lb (68.9 kg)   SpO2 100%   BMI 25.29 kg/m   Patient alert and oriented and in no cardiopulmonary distress.  HEENT: No facial asymmetry, EOMI,   oropharynx pink and moist.  Neck supple no JVD, no mass.  Chest: Clear to auscultation bilaterally.  CVS: S1, S2 no murmurs, no S3.Regular rate.  ABD: Soft non tender.   Ext: No edema  MS: Adequate ROM spine, shoulders, hips and knees.  Skin: Intact, no ulcerations or rash noted.  Psych: Good eye contact, normal affect.  Memory intact not anxious or depressed appearing.Tearful at times in the interview but certainly appropriately so  CNS: CN 2-12 intact, power,  normal throughout.no focal deficits noted.   Assessment & Plan  Encounter for other general examination General history and exam as documented. Well health discussed and need to commit to regular exercise ,  Goal of 150 minutes per week, and plant based diet. Goal of 7 hours of sleep per night with good sleep hygiene. Cancer screening needs also discussed Encouraged attendance at a few grief sessions, overall handling husband's passing veery well, and has excellent family support, she is also the mother of 2 adult sons and a grandmother  Need for Tdap vaccination After obtaining informed consent, the vaccine is  administered by LPN.   Grief reaction Appropriate grief assessed during 12/2016 visit. Encouraged patient to commit to regular exercise, improved sleep and to look at attending a few bereavement group sessions offered locally. Will reassess in 6 months. No medication indicated at this time, family is very supportive

## 2017-01-08 NOTE — Patient Instructions (Signed)
F/u in 6 month, call if you need me before  Please call [phone] or go to radiology det at [facility] to schedule your mammogram  TdaP today  Please call and schedule your pap smear.  Fasting CBC, lipid, cmp, TSH , HIV and Vit D next Monday  Sign in to my chart  Please commit to 30 mins exercise 5 days per week for total wellbeing   Bedtime TV and lights off 10:30 pm  Support groups can be extremely helpful with grief management  Thank you  for choosing [facility]. We consider it a privelige to serve you.  Delivering excellent health care in a caring and  compassionate way is our goal.  Partnering with you,  so that together we can achieve this goal is our strategy.

## 2017-01-08 NOTE — Assessment & Plan Note (Signed)
General history and exam as documented. Well health discussed and need to commit to regular exercise ,  Goal of 150 minutes per week, and plant based diet. Goal of 7 hours of sleep per night with good sleep hygiene. Cancer screening needs also discussed Encouraged attendance at a few grief sessions, overall handling husband's passing veery well, and has excellent family support, she is also the mother of 2 adult sons and a grandmother

## 2017-01-08 NOTE — Assessment & Plan Note (Signed)
After obtaining informed consent, the vaccine is  administered by LPN.  

## 2017-01-08 NOTE — Assessment & Plan Note (Signed)
Appropriate grief assessed during 12/2016 visit. Encouraged patient to commit to regular exercise, improved sleep and to look at attending a few bereavement group sessions offered locally. Will reassess in 6 months. No medication indicated at this time, family is very supportive

## 2017-01-15 ENCOUNTER — Other Ambulatory Visit: Payer: Self-pay | Admitting: Family Medicine

## 2017-01-16 LAB — VITAMIN D 25 HYDROXY (VIT D DEFICIENCY, FRACTURES): VIT D 25 HYDROXY: 6 ng/mL — AB (ref 30–100)

## 2017-01-16 LAB — COMPREHENSIVE METABOLIC PANEL
ALBUMIN: 4.1 g/dL (ref 3.6–5.1)
ALT: 8 U/L (ref 6–29)
AST: 13 U/L (ref 10–35)
Alkaline Phosphatase: 51 U/L (ref 33–115)
BUN: 13 mg/dL (ref 7–25)
CHLORIDE: 105 mmol/L (ref 98–110)
CO2: 26 mmol/L (ref 20–31)
CREATININE: 0.72 mg/dL (ref 0.50–1.10)
Calcium: 9 mg/dL (ref 8.6–10.2)
Glucose, Bld: 77 mg/dL (ref 65–99)
POTASSIUM: 4.2 mmol/L (ref 3.5–5.3)
SODIUM: 139 mmol/L (ref 135–146)
Total Bilirubin: 0.8 mg/dL (ref 0.2–1.2)
Total Protein: 7.8 g/dL (ref 6.1–8.1)

## 2017-01-16 LAB — CBC
HEMATOCRIT: 32.1 % — AB (ref 35.0–45.0)
Hemoglobin: 8.8 g/dL — ABNORMAL LOW (ref 11.7–15.5)
MCH: 17.7 pg — ABNORMAL LOW (ref 27.0–33.0)
MCHC: 27 g/dL — ABNORMAL LOW (ref 32.0–36.0)
MCV: 64.4 fL — AB (ref 80.0–100.0)
PLATELETS: 347 10*3/uL (ref 140–400)
RBC: 4.96 MIL/uL (ref 3.80–5.10)
RDW: 19.2 % — ABNORMAL HIGH (ref 11.0–15.0)
WBC: 3.9 10*3/uL (ref 3.8–10.8)

## 2017-01-16 LAB — LIPID PANEL
CHOL/HDL RATIO: 2.2 ratio (ref <5.0)
Cholesterol: 175 mg/dL (ref <200)
HDL: 78 mg/dL (ref >50)
LDL CALC: 85 mg/dL (ref <100)
Triglycerides: 62 mg/dL (ref <150)
VLDL: 12 mg/dL (ref <30)

## 2017-01-16 LAB — HIV ANTIBODY (ROUTINE TESTING W REFLEX): HIV 1&2 Ab, 4th Generation: NONREACTIVE

## 2017-01-16 LAB — TSH: TSH: 0.86 mIU/L

## 2017-01-17 LAB — IRON,TIBC AND FERRITIN PANEL
%SAT: 3 % — ABNORMAL LOW (ref 11–50)
Ferritin: 2 ng/mL — ABNORMAL LOW (ref 10–232)
Iron: 12 ug/dL — ABNORMAL LOW (ref 40–190)
TIBC: 454 ug/dL — ABNORMAL HIGH (ref 250–450)

## 2017-01-17 LAB — B12 AND FOLATE PANEL
FOLATE: 7.1 ng/mL (ref >5.4)
Vitamin B-12: 494 pg/mL (ref 200–1100)

## 2017-01-18 ENCOUNTER — Other Ambulatory Visit: Payer: Self-pay | Admitting: Family Medicine

## 2017-01-18 MED ORDER — ERGOCALCIFEROL 1.25 MG (50000 UT) PO CAPS: 50000.0000 [IU] | ORAL_CAPSULE | ORAL | 1 refills | Status: DC

## 2017-01-19 ENCOUNTER — Telehealth: Payer: Self-pay

## 2017-01-19 DIAGNOSIS — E611 Iron deficiency: Secondary | ICD-10-CM | POA: Insufficient documentation

## 2017-01-19 DIAGNOSIS — E876 Hypokalemia: Secondary | ICD-10-CM

## 2017-01-19 DIAGNOSIS — E569 Vitamin deficiency, unspecified: Secondary | ICD-10-CM

## 2017-01-19 DIAGNOSIS — D539 Nutritional anemia, unspecified: Secondary | ICD-10-CM | POA: Insufficient documentation

## 2017-01-19 NOTE — Telephone Encounter (Signed)
Labs ordered.

## 2017-01-19 NOTE — Telephone Encounter (Signed)
-----   Message from Fayrene Helper, MD sent at 01/18/2017 12:42 PM EDT ----- Your labs are excellent, EXCEPT ,as we discussed , you are VERY anemic and iron deficient, also you are deficient in vitamin D  You need to start slow iron (slow Fe) 65 mg one twice daily , which is over the counter, you may take colace 100mg  , a stool softener twice daily to help if you have constipation with this.  Please also discuss at your next gyne visit since you report heavy periods.  Your vitamin D level is low and once weekly vitamin D is prescribed for 6 months total.After this start OTC once daily vit D3 2000 IU  Please have non fasting labs drawn in the next 5. 5 months, the 2nd  week of November, CBC, iron and ferritin and vitamin D level.  Lab order is mailed with this note

## 2017-01-30 ENCOUNTER — Encounter: Payer: Self-pay | Admitting: Adult Health

## 2017-01-30 ENCOUNTER — Other Ambulatory Visit (HOSPITAL_COMMUNITY)
Admission: RE | Admit: 2017-01-30 | Discharge: 2017-01-30 | Disposition: A | Discharge status: Home / Self Care | Payer: Commercial Managed Care - HMO | Source: Ambulatory Visit | Attending: Adult Health | Admitting: Adult Health

## 2017-01-30 ENCOUNTER — Ambulatory Visit (INDEPENDENT_AMBULATORY_CARE_PROVIDER_SITE_OTHER): Payer: Commercial Managed Care - HMO | Admitting: Adult Health

## 2017-01-30 VITALS — BP 94/56 | HR 58 | Ht 65.0 in | Wt 151.0 lb

## 2017-01-30 DIAGNOSIS — Z01419 Encounter for gynecological examination (general) (routine) without abnormal findings: Secondary | ICD-10-CM | POA: Insufficient documentation

## 2017-01-30 DIAGNOSIS — Z1211 Encounter for screening for malignant neoplasm of colon: Secondary | ICD-10-CM | POA: Diagnosis not present

## 2017-01-30 DIAGNOSIS — D500 Iron deficiency anemia secondary to blood loss (chronic): Secondary | ICD-10-CM | POA: Insufficient documentation

## 2017-01-30 DIAGNOSIS — Z1212 Encounter for screening for malignant neoplasm of rectum: Secondary | ICD-10-CM

## 2017-01-30 DIAGNOSIS — N852 Hypertrophy of uterus: Secondary | ICD-10-CM

## 2017-01-30 DIAGNOSIS — N920 Excessive and frequent menstruation with regular cycle: Secondary | ICD-10-CM

## 2017-01-30 LAB — HEMOCCULT GUIAC POC 1CARD (OFFICE): Fecal Occult Blood, POC: NEGATIVE

## 2017-01-30 NOTE — Progress Notes (Signed)
Patient ID: Dawn [name], female   DOB: 02-02-1966, 51 y.o.   MRN: [id number] History of Present Illness: Dawn [name] is a 51 year old black female, widowed in for well woman gyn exam and pap, last pap in 2013.Her periods are heavy for 5 days and changes every 3 hours, no pain and is tired, is anemic and on iron from Dr Moshe Cipro. Her husband died in 10-01-22.  PCP is Dr Moshe Cipro.    Current Medications, Allergies, Past Medical History, Past Surgical History, Family History and Social History were reviewed in [employer] record.     Review of Systems: Patient denies any headaches, hearing loss, blurred vision, shortness of breath, chest pain, abdominal pain, problems with bowel movements, urination, or intercourse(not currently active) No joint pain or mood swings.See HPI for positives.    Physical Exam:BP (!) 94/56 (BP Location: Left Arm, Patient Position: Sitting, Cuff Size: Normal)   Pulse (!) 58   Ht 5\' 5"  (1.651 m)   Wt 151 lb (68.5 kg)   LMP 01/23/2017 (Approximate)   BMI 25.13 kg/m  General:  Well developed, well nourished, no acute distress Skin:  Warm and dry Neck:  Midline trachea, normal thyroid, good ROM, no lymphadenopathy Lungs; Clear to auscultation bilaterally Breast:  No dominant palpable mass, retraction, or nipple discharge Cardiovascular: Regular rate and rhythm Abdomen:  Soft, non tender, no hepatosplenomegaly Pelvic:  External genitalia is normal in appearance, no lesions.  The vagina is normal in appearance. Urethra has no lesions or masses. The cervix is bulbous, and smooth, pap with HVP performed.  Uterus is felt to be enlarged about 10-12 weeks.  No adnexal masses or tenderness noted.Bladder is non tender, no masses felt. Rectal: Good sphincter tone, no polyps, or hemorrhoids felt.  Hemoccult negative. Extremities/musculoskeletal:  No swelling or varicosities noted, no clubbing or cyanosis Psych:  No mood changes, alert and cooperative,seems  happy PHQ 2 score 1.Discussed will get [place] to assess uterus and when results back will talk about options to stop the bleeding.   Impression:  1. Encounter for gynecological examination with Papanicolaou smear of cervix   2. Menorrhagia with regular cycle   3. Iron deficiency anemia due to chronic blood loss   4. Enlarged uterus   5. Encounter for colorectal cancer screening      Plan: Return in 1 week for GYN [place] Physical in 1 year Pap in 3 if normal Mammogram now and yearly Colonoscopy at 54 Labs with PCP Review handout on menorrhagia

## 2017-01-30 NOTE — Patient Instructions (Signed)

## 2017-02-01 LAB — CYTOLOGY - PAP
Adequacy: ABSENT
DIAGNOSIS: NEGATIVE
HPV: NOT DETECTED

## 2017-02-06 ENCOUNTER — Ambulatory Visit (INDEPENDENT_AMBULATORY_CARE_PROVIDER_SITE_OTHER): Payer: Commercial Managed Care - HMO

## 2017-02-06 DIAGNOSIS — N852 Hypertrophy of uterus: Secondary | ICD-10-CM | POA: Diagnosis not present

## 2017-02-06 DIAGNOSIS — N920 Excessive and frequent menstruation with regular cycle: Secondary | ICD-10-CM

## 2017-02-06 NOTE — Progress Notes (Signed)
PELVIC US TA/TV: enlarged heterogeneous anteverted uterus w/a anterior left subserosal fibroid 4.1 x 4.7 x 4.3 cm,EEC 11 mm,normal ovaries bilat

## 2017-02-09 ENCOUNTER — Telehealth: Payer: Self-pay | Admitting: Adult Health

## 2017-02-09 ENCOUNTER — Encounter: Payer: Self-pay | Admitting: Adult Health

## 2017-02-09 DIAGNOSIS — D219 Benign neoplasm of connective and other soft tissue, unspecified: Secondary | ICD-10-CM | POA: Insufficient documentation

## 2017-02-09 DIAGNOSIS — D252 Subserosal leiomyoma of uterus: Secondary | ICD-10-CM

## 2017-02-09 HISTORY — DX: Benign neoplasm of connective and other soft tissue, unspecified: D21.9

## 2017-02-09 NOTE — Telephone Encounter (Signed)
Left message to call me back, US show fibroid and could try IUD to control bleeding, or even hysterectomy is option.

## 2017-03-09 ENCOUNTER — Telehealth: Payer: Self-pay | Admitting: Family Medicine

## 2017-03-09 DIAGNOSIS — Z7689 Persons encountering health services in other specified circumstances: Secondary | ICD-10-CM | POA: Diagnosis not present

## 2017-03-09 DIAGNOSIS — R21 Rash and other nonspecific skin eruption: Secondary | ICD-10-CM | POA: Diagnosis not present

## 2017-03-09 DIAGNOSIS — W57XXXA Bitten or stung by nonvenomous insect and other nonvenomous arthropods, initial encounter: Secondary | ICD-10-CM | POA: Diagnosis not present

## 2017-03-09 NOTE — Telephone Encounter (Signed)
FYI ONLY PER PT----Patients rash came up yesterday and its getting worse on her arms and she has a bump that came up on her neck and she was bitten by a tick on Wednesday. Raised rash with redness and getting worse. Advised urgent care evaluation and told her of [place] location since [facility]'t accept her insurance

## 2017-03-09 NOTE — Telephone Encounter (Signed)
Patient found a tick on her L leg (inside thigh) Wednesday night. Since Wednesday she is breaking out (like a rash) on both arms, but mostly the right. She has also noticed a red spot on the R side of her neck.  She is concerned that this is related to the tick bite.  The bite site on her inside thigh is raised, swollen, and red.  She pulled the tick off and applied alcohol. Please call and advise.

## 2017-03-14 NOTE — Telephone Encounter (Signed)
Pls advise pt and ask scheduling staff arrange an urgent care follow up visit with me one day nex t week, she will be seen as a work in, pls let her know I am out of the office until next week, and will absolutely see her in f/u to review labs rash and concerns in the office , telephone consultation today not an option and not having evaluated her and with no notes to review,  of little to no benefit  PLS get appt info bEFORE you call pt so she gets one phone call!

## 2017-03-14 NOTE — Telephone Encounter (Signed)
Patient calling to let you know that she went to Urgent Care in [place] on Friday and  Dr. Jomarie Longs prescribed prednisone and doxycycline.  He also did blood work but the results are still not in as of yesterday.  The rash is still a concern for her. It is not "itchy" but painful to the touch.  Please advise ASAP.  She is flexible today should you have time to see her or discuss these symptoms.

## 2017-03-15 NOTE — Telephone Encounter (Signed)
July 25 at 8

## 2017-03-19 ENCOUNTER — Ambulatory Visit: Payer: Commercial Managed Care - HMO | Admitting: Family Medicine

## 2017-03-19 ENCOUNTER — Telehealth: Payer: Self-pay | Admitting: Family Medicine

## 2017-03-19 NOTE — Telephone Encounter (Signed)
Patient called nurse line regarding tick bite and rash. She states that the UC just called and gave her the results of her labs- she has [facility] Spotted Fever. She was told to stay on and finish up the course of abx and steroids given to her. She will be keeping her appointment on Wednesday.  She wants to know if there is anything else she needs to do in the meantime.

## 2017-03-21 ENCOUNTER — Ambulatory Visit (INDEPENDENT_AMBULATORY_CARE_PROVIDER_SITE_OTHER): Payer: 59 | Admitting: Family Medicine

## 2017-03-21 ENCOUNTER — Encounter: Payer: Self-pay | Admitting: Family Medicine

## 2017-03-21 VITALS — BP 120/74 | HR 83 | Temp 97.4°F | Resp 16 | Ht 65.0 in | Wt 149.2 lb

## 2017-03-21 DIAGNOSIS — R768 Other specified abnormal immunological findings in serum: Secondary | ICD-10-CM | POA: Diagnosis not present

## 2017-03-21 DIAGNOSIS — F4321 Adjustment disorder with depressed mood: Secondary | ICD-10-CM

## 2017-03-21 DIAGNOSIS — D500 Iron deficiency anemia secondary to blood loss (chronic): Secondary | ICD-10-CM

## 2017-03-21 DIAGNOSIS — F4320 Adjustment disorder, unspecified: Secondary | ICD-10-CM | POA: Diagnosis not present

## 2017-03-21 DIAGNOSIS — E559 Vitamin D deficiency, unspecified: Secondary | ICD-10-CM | POA: Diagnosis not present

## 2017-03-21 DIAGNOSIS — A770 Spotted fever due to Rickettsia rickettsii: Secondary | ICD-10-CM | POA: Diagnosis not present

## 2017-03-21 DIAGNOSIS — E611 Iron deficiency: Secondary | ICD-10-CM | POA: Diagnosis not present

## 2017-03-21 NOTE — Patient Instructions (Addendum)
F/u as before , call if you need me sooner  You will be referred to specialist re potential infectious exposure and mangement needed, and you may also be referred  To GI specialist  Blood tests to follow and stool tests also, I will contact you later today once I have determined what the next best tests are, esp with regard to tick infection test of cure, and also parasite exposure  Specific answer will also be to adequacy of treatment received for your tick infections

## 2017-03-22 ENCOUNTER — Telehealth: Payer: Self-pay | Admitting: Family Medicine

## 2017-03-22 DIAGNOSIS — D649 Anemia, unspecified: Secondary | ICD-10-CM

## 2017-03-22 DIAGNOSIS — A770 Spotted fever due to Rickettsia rickettsii: Secondary | ICD-10-CM

## 2017-03-22 DIAGNOSIS — A6920 Lyme disease, unspecified: Secondary | ICD-10-CM

## 2017-03-22 DIAGNOSIS — R768 Other specified abnormal immunological findings in serum: Secondary | ICD-10-CM | POA: Insufficient documentation

## 2017-03-22 NOTE — Assessment & Plan Note (Addendum)
Pt had been improving and coping well wih the sudden death of her spouse, however , this past weekend , on learning of the cause ofdeath her grief is now re activated, she has good family support , bu will likely need therapy, will hold off on referral at this time

## 2017-03-22 NOTE — Telephone Encounter (Signed)
-----   Message from Fayrene Helper, MD sent at 03/22/2017  7:44 AM EDT ----- Regarding: labs to be ordered please Stool for ova and parasite  cBc and anemia panel, cmp  Thanks DX are anemia and lyme disease and RMSF, tx  ?? pls ask

## 2017-03-22 NOTE — Assessment & Plan Note (Addendum)
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 She has been treated with doxycycline 100 mg twice daily for 10 days and prednisone 5 mg dose pack x 12 days with improvement in symptoms. Was never febrile, and her only manifestation was on the skin of her forearms reportedly. Has concerns re need for f/u lab tests needed , as well as need for additrional treatment, will reach out to iD as there are additional concerns of possible parasitic exposure in this patient

## 2017-03-22 NOTE — Assessment & Plan Note (Signed)
51 y/o AA female with menorrhagia as obvious cause of IDA, however , due to race she is at increased risk of colon cancer , also recently lost her otherwise healthy spouse to reportedly systemic parasitic infestation,  Stool for o/p, rept cbc and iron, and urgent referral to GI

## 2017-03-22 NOTE — Assessment & Plan Note (Signed)
Repeat lab

## 2017-03-22 NOTE — Progress Notes (Signed)
SHUNNA MIKAELIAN     MRN: [id number]      DOB: 12-16-1965   HPI Ms. Dawn [name] is here for follow up and re-evaluation . She removed a tick from left groin on 03/05/2017 and was treated  within 2 days at the urgent care doxycycline and prednisone after developing a very pruritic rash on both forearms for presumed tick borne illness. 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 She has concerns re the need for any additional testing needed for test of cure, or any additional treatment needed. A 2nd concern is that she found out this past weekend , that her otherwise healthy spouse , following an approx 6 month h/o malaise and investigation of elevated WBC , per patient , died of systemic parasite infestation per Ms Torosyan.she is very concerned about the possibility of being infected herself and what she needs to do to ensure that she is not similarly diseased. She does have significant anemia , iron deficiency, which was presumed to be due to heavy menses only , she is near the age 51 and a colonoscopy and stool testing for parasitic infestation as well as ID consultation I believe are al lvery appropriate asap She denies any GI symptoms  ROS Denies recent fever or chills. Denies sinus pressure, nasal congestion, ear pain or sore throat. Denies chest congestion, productive cough or wheezing. Denies chest pains, palpitations and leg swelling Denies abdominal pain, nausea, vomiting,diarrhea or constipation.   Denies dysuria, frequency, hesitancy or incontinence. Denies joint pain, swelling and limitation in mobility. Denies headaches, seizures, numbness, or tingling. Now again faced with re addressing death of her spouse and grieving, may need therapy   PE  BP 120/74 (BP Location: Left Arm, Patient Position: Sitting, Cuff Size: Normal)   Pulse 83   Temp (!) 97.4 F (36.3 C) (Other (Comment))   Resp 16   Ht 5\' 5"  (1.651  m)   Wt 149 lb 4 oz (67.7 kg)   LMP 03/07/2017   SpO2 100%   BMI 24.84 kg/m   Patient alert and oriented and in no cardiopulmonary distress.  HEENT: No facial asymmetry, EOMI,   oropharynx pink and moist.  Neck supple no JVD, no mass.  Chest: Clear to auscultation bilaterally.  CVS: S1, S2 no murmurs, no S3.Regular rate.  ABD: Soft non tender.   Ext: No edema  MS: Adequate ROM spine, shoulders, hips and knees.  Skin: Intact, erythematous rash, macular on both upper extremities   Psych: Good eye contact, flat and depressed  affect. Memory intact tearful and sad  CNS: CN 2-12 intact, power,  normal throughout.no focal deficits noted.   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 She has been treated with doxycycline and prednisone with improvement in symptoms. Was never febrile, and her only manifestation was on the skin of her forearms reportedly. Has concerns re need for f/u lab tests needed , as well as need for additrional treatment, will reach out to iD as there are additional  concerns of possible parasitic exposure in this patient  RMSF [facility] spotted fever) Pt denies ever being febril, and has received course of doxycycline, ID involvement will still be requested  Grief reaction Pt had been improving and coping well wih the sudden death of her spouse, however , this past weekend , on learning of the cause ofdeath her grief is now re activated, she has good family support , bu will likely need therapy, will hold off on referral at this time  Iron  deficiency Repeat lab  Vitamin D deficiency Once weekly supplemental vit D

## 2017-03-22 NOTE — Assessment & Plan Note (Signed)
Once weekly supplemental vit D

## 2017-03-22 NOTE — Assessment & Plan Note (Signed)
Pt denies ever being febril, and has received course of doxycycline, ID involvement will still be requested

## 2017-03-23 LAB — COMPLETE METABOLIC PANEL WITH GFR
ALT: 9 U/L (ref 6–29)
AST: 12 U/L (ref 10–35)
Albumin: 4.1 g/dL (ref 3.6–5.1)
Alkaline Phosphatase: 46 U/L (ref 33–115)
BILIRUBIN TOTAL: 0.9 mg/dL (ref 0.2–1.2)
BUN: 12 mg/dL (ref 7–25)
CO2: 26 mmol/L (ref 20–31)
CREATININE: 0.74 mg/dL (ref 0.50–1.10)
Calcium: 9.1 mg/dL (ref 8.6–10.2)
Chloride: 101 mmol/L (ref 98–110)
GFR, Est African American: >89 mL/min (ref >=60)
GFR, Est Non African American: >89 mL/min (ref >=60)
GLUCOSE: 86 mg/dL (ref 65–99)
Potassium: 4.3 mmol/L (ref 3.5–5.3)
SODIUM: 137 mmol/L (ref 135–146)
TOTAL PROTEIN: 7.4 g/dL (ref 6.1–8.1)

## 2017-03-23 LAB — IRON AND TIBC
%SAT: 14 % (ref 11–50)
Iron: 45 ug/dL (ref 40–190)
TIBC: 325 ug/dL (ref 250–450)
UIBC: 280 ug/dL

## 2017-03-23 LAB — CBC
HEMATOCRIT: 38.4 % (ref 35.0–45.0)
Hemoglobin: 11.6 g/dL — ABNORMAL LOW (ref 11.7–15.5)
MCH: 21.2 pg — ABNORMAL LOW (ref 27.0–33.0)
MCHC: 30.2 g/dL — AB (ref 32.0–36.0)
MCV: 70.2 fL — ABNORMAL LOW (ref 80.0–100.0)
Platelets: 248 10*3/uL (ref 140–400)
RBC: 5.47 MIL/uL — ABNORMAL HIGH (ref 3.80–5.10)
RDW: 19.4 % — AB (ref 11.0–15.0)
WBC: 4.6 10*3/uL (ref 3.8–10.8)

## 2017-03-24 LAB — ANEMIA PANEL
ABS Retic: 60170 cells/uL (ref 20000–80000)
FERRITIN: 9 ng/mL — AB (ref 10–232)
Folate: 8.3 ng/mL (ref >5.4)
RBC.: 5.47 MIL/uL — AB (ref 3.80–5.10)
Retic Ct Pct: 1.1 %
Vitamin B-12: 464 pg/mL (ref 200–1100)

## 2017-03-25 ENCOUNTER — Encounter: Payer: Self-pay | Admitting: Family Medicine

## 2017-03-26 LAB — OVA AND PARASITE EXAMINATION: OP: NONE SEEN

## 2017-03-27 ENCOUNTER — Ambulatory Visit (INDEPENDENT_AMBULATORY_CARE_PROVIDER_SITE_OTHER): Payer: 59 | Admitting: Internal Medicine

## 2017-03-27 ENCOUNTER — Encounter (INDEPENDENT_AMBULATORY_CARE_PROVIDER_SITE_OTHER): Payer: Self-pay | Admitting: Internal Medicine

## 2017-03-27 VITALS — BP 98/72 | HR 67 | Temp 98.2°F | Resp 18 | Ht 65.0 in | Wt 149.1 lb

## 2017-03-27 DIAGNOSIS — D509 Iron deficiency anemia, unspecified: Secondary | ICD-10-CM

## 2017-03-27 NOTE — Patient Instructions (Signed)
Esophagogastroduodenoscopy and colonoscopy to be scheduled 

## 2017-03-27 NOTE — Progress Notes (Signed)
Reason for consultation.  Iron deficiency anemia.  History of present illness.:  Patient is 51 year old African-American female who is referred through courtesy of Dr. Tula Nakayama for GI evaluation. She was found to have iron deficiency anemia on routine testing. She has responded to by mouth iron. Her hemoglobin has come from 8.8 about 2 months ago to 11.6 few days ago. She says she stool was guaiac negative. She denies melena or rectal bleeding or diarrhea. She is prone to constipation which she tries to control with diet. She denies frequent heartburn dysphagia nausea vomiting. She uses Advil occasionally. She noted craving for raisins. This craving stopped recently. She has been feeling tired and upset because of unexpected loss of her husband in January this year. She has been blaming these symptoms on his loss. She has lost 8 pounds. She had normal pelvic exam 1 month ago. She suffered tic bite about 3 weeks ago. She was evaluated at urgent care and noted to have positive antibody for [facility] spotted fever as well as Lyme's disease. She was treated with doxycycline for 10 days. Review of the systems is positive for heavy periods which she's had for the last 15 years. Periods generally last for 5 days. She does not donate blood. She is not on limited diet.    Current Medications: Outpatient Encounter Prescriptions as of 03/27/2017  Medication Sig  . Ferrous Sulfate (IRON SLOW RELEASE PO) Take by mouth 2 (two) times daily.  . Inulin (FIBERCHOICE PO) Take by mouth daily.  . ergocalciferol (VITAMIN D2) 50000 units capsule Take 1 capsule (50,000 Units total) by mouth once a week. One capsule once weekly (Patient not taking: Reported on 03/21/2017)  . [DISCONTINUED] predniSONE (DELTASONE) 5 MG tablet TAKE AS DIRECTED ON ATTACHED PAPER   No facility-administered encounter medications on file as of 03/27/2017.    Past medical history:  C-section in 21 and again in 1990. Tubal  ligation possibly in 2005. Recent diagnosis of vitamin D deficiency. She recently suffered tic bite and developed rash and was treated with doxycycline for 10 days.   Allergies: No Known Allergies  Family history:  Father is 45 years old and has hypertension. Mother is 52 and she also has hypertension. She is receiving injections for eye problems? Macular degeneration. She has one sister age 73 in good health. Maternal grandfather had colon cancer in his 70s.   Social history: Patient lost her husband in January 18 of disseminated infection. He was 49 years old. 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 She does not smoke cigarettes or drink alcohol. She has 2 sons in good health.   Physical examination: Blood pressure 98/72, pulse 67, temperature 98.2 F (36.8 C), temperature source Oral, resp. rate 18, height 5\' 5"  (1.651 m), weight 149 lb 1.6 oz (67.6 kg), last menstrual period 03/07/2017. Patient is alert and in no acute distress. Conjunctiva is pink. Sclera is nonicteric Oropharyngeal mucosa is normal. No neck masses or thyromegaly noted. Cardiac exam with regular rhythm normal S1 and S2. No murmur or gallop noted. Lungs are clear to auscultation. Abdomen is symmetrical. Bowel sounds normal. On palpation abdomen is soft and nontender without organomegaly or masses. No LE edema or clubbing noted. She has faint macular rash over both forearms.  Labs/studies Results: Lab data from 01/19/2017  WBC 3.9, H&H 8.8 and 32.1 and platelet count 347K. MCV 64.4. Serum iron 12 TIBC 454 and saturation 3%.  Serum ferritin 2.   Lab data from 03/23/2017  WBC 4.6, H&H 11.6 and 38.4 and platelet count 248K. MCV 70.2. Serum iron 45, TIBC 325 and saturation 14%.  Serum ferritin 9    Assessment:  Patient is 51 year old African-American female who was recently found to have iron deficiency anemia. Her stool is guaiac negative. She does not have any GI symptoms such as  chronic heartburn epigastric pain diarrhea melena or rectal bleeding. History significant for chronically heavy periods. She has responded to by mouth iron and she will need to continue for several months. Given her presentation need to rule out occult GI neoplasm or celiac disease.   Recommendations:  Diagnostic esophagogastroduodenoscopy and colonoscopy to be scheduled in the future. Patient will need to be off by mouth iron for 7-10 days.

## 2017-03-28 ENCOUNTER — Encounter (INDEPENDENT_AMBULATORY_CARE_PROVIDER_SITE_OTHER): Payer: Self-pay | Admitting: *Deleted

## 2017-03-28 ENCOUNTER — Other Ambulatory Visit (INDEPENDENT_AMBULATORY_CARE_PROVIDER_SITE_OTHER): Payer: Self-pay | Admitting: *Deleted

## 2017-03-28 DIAGNOSIS — D649 Anemia, unspecified: Secondary | ICD-10-CM | POA: Insufficient documentation

## 2017-03-28 DIAGNOSIS — D508 Other iron deficiency anemias: Secondary | ICD-10-CM

## 2017-03-28 NOTE — Telephone Encounter (Deleted)
Patient needs trilyte 

## 2017-03-28 NOTE — Telephone Encounter (Signed)
This encounter was created in error - please disregard.

## 2017-04-24 ENCOUNTER — Ambulatory Visit (INDEPENDENT_AMBULATORY_CARE_PROVIDER_SITE_OTHER): Payer: 59

## 2017-04-24 ENCOUNTER — Telehealth: Payer: Self-pay | Admitting: Family Medicine

## 2017-04-24 ENCOUNTER — Encounter (HOSPITAL_COMMUNITY): Payer: Self-pay

## 2017-04-24 ENCOUNTER — Other Ambulatory Visit: Payer: Self-pay | Admitting: Family Medicine

## 2017-04-24 ENCOUNTER — Telehealth (INDEPENDENT_AMBULATORY_CARE_PROVIDER_SITE_OTHER): Payer: Self-pay | Admitting: *Deleted

## 2017-04-24 ENCOUNTER — Telehealth: Payer: Self-pay

## 2017-04-24 ENCOUNTER — Other Ambulatory Visit: Payer: Self-pay

## 2017-04-24 ENCOUNTER — Emergency Department (HOSPITAL_COMMUNITY)
Admission: EM | Admit: 2017-04-24 | Discharge: 2017-04-24 | Disposition: A | Discharge status: Home / Self Care | Payer: 59 | Attending: Emergency Medicine | Admitting: Emergency Medicine

## 2017-04-24 VITALS — BP 100/72

## 2017-04-24 DIAGNOSIS — N3090 Cystitis, unspecified without hematuria: Secondary | ICD-10-CM | POA: Diagnosis not present

## 2017-04-24 DIAGNOSIS — Z79899 Other long term (current) drug therapy: Secondary | ICD-10-CM | POA: Insufficient documentation

## 2017-04-24 DIAGNOSIS — R42 Dizziness and giddiness: Secondary | ICD-10-CM | POA: Diagnosis not present

## 2017-04-24 DIAGNOSIS — R55 Syncope and collapse: Secondary | ICD-10-CM | POA: Diagnosis not present

## 2017-04-24 DIAGNOSIS — E876 Hypokalemia: Secondary | ICD-10-CM

## 2017-04-24 LAB — BASIC METABOLIC PANEL
ANION GAP: 12 (ref 5–15)
BUN: 9 mg/dL (ref 6–20)
CHLORIDE: 98 mmol/L — AB (ref 101–111)
CO2: 23 mmol/L (ref 22–32)
Calcium: 8.7 mg/dL — ABNORMAL LOW (ref 8.9–10.3)
Creatinine, Ser: 0.7 mg/dL (ref 0.44–1.00)
GFR calc non Af Amer: >60 mL/min (ref >60)
Glucose, Bld: 101 mg/dL — ABNORMAL HIGH (ref 65–99)
POTASSIUM: 3.2 mmol/L — AB (ref 3.5–5.1)
SODIUM: 133 mmol/L — AB (ref 135–145)

## 2017-04-24 LAB — CBC WITH DIFFERENTIAL/PLATELET
BASOS ABS: 0 10*3/uL (ref 0.0–0.1)
Basophils Relative: 0 %
Eosinophils Absolute: 0 10*3/uL (ref 0.0–0.7)
Eosinophils Relative: 0 %
HEMATOCRIT: 36.5 % (ref 36.0–46.0)
HEMOGLOBIN: 11.3 g/dL — AB (ref 12.0–15.0)
LYMPHS PCT: 11 %
Lymphs Abs: 0.7 10*3/uL (ref 0.7–4.0)
MCH: 21.5 pg — ABNORMAL LOW (ref 26.0–34.0)
MCHC: 31 g/dL (ref 30.0–36.0)
MCV: 69.5 fL — ABNORMAL LOW (ref 78.0–100.0)
MONOS PCT: 5 %
Monocytes Absolute: 0.3 10*3/uL (ref 0.1–1.0)
NEUTROS ABS: 5.8 10*3/uL (ref 1.7–7.7)
Neutrophils Relative %: 84 %
Platelets: 269 10*3/uL (ref 150–400)
RBC: 5.25 MIL/uL — AB (ref 3.87–5.11)
RDW: 16.3 % — ABNORMAL HIGH (ref 11.5–15.5)
WBC: 6.8 10*3/uL (ref 4.0–10.5)

## 2017-04-24 MED ORDER — POTASSIUM CHLORIDE CRYS ER 20 MEQ PO TBCR: 20.0000 meq | EXTENDED_RELEASE_TABLET | Freq: Three times a day (TID) | ORAL | 0 refills | Status: DC

## 2017-04-24 MED ORDER — SODIUM CHLORIDE 0.9 % IV SOLN: INTRAVENOUS | Status: DC

## 2017-04-24 MED ORDER — SODIUM CHLORIDE 0.9 % IV BOLUS (SEPSIS)
1000.0000 mL | Freq: Once | INTRAVENOUS | Status: AC
  Administered 2017-04-24: 1000 mL via INTRAVENOUS

## 2017-04-24 NOTE — Telephone Encounter (Signed)
Five days of potassium sent in for 3 times daily, rept in 2 days a the chem 7 . The EKG in the ED is normal, she is anxious , I understand, and yes she does need the potassium supplemented, having the prep would absolutely make the potassium lower. I recommend she get in touch with Dr Laural Golden to come up with plan B as far as the colonoscopy  Is concerned. The great news is that her blood count is now normal !

## 2017-04-24 NOTE — Telephone Encounter (Signed)
FYI: Patient left message to cancel TCS/EGD sch'd tomorrow (8/29) -- stated she had to go to ED this morning due to  weakness and being nauseated, procedures have been canceled

## 2017-04-24 NOTE — Telephone Encounter (Signed)
Patient left message on nurse line stating that she was scheduled for a colonoscopy tomorrow but ended up in the emergency room today with nausea, dizziness and jitters. She states that she has been feeling sick and has called to let Dr. Laural Golden know she will not be doing the colonoscopy tomorrow, also wanted to let Dr. Moshe Cipro know as well. She states the hospital gave her fluids and drew labs, but nothing has come back abnormal.  Callback# [phone]

## 2017-04-24 NOTE — ED Triage Notes (Signed)
Pt reports is prepping for a colonoscopy that is scheduled for tomorrow so she hasn't eaten since 2pm yesterday.  Reports took a laxative last night and after having a BM this morning pt started having chills, feeling weak, and lightheaded.  Pt says she did eat a little bit of a banana this morning.  Pt also says is on an iron supplement because she has heavy periods and stopped it yesterday as well.  Pt started her period Sunday.

## 2017-04-24 NOTE — Telephone Encounter (Signed)
HERE NOW- Was prepping for colonoscopy and had to go to ER this am for weakness, twitching and her heart fluttering. States this is the same way she felt when her potassium was low. Was told at ER that at 3.2 her potassium was not low. She had to cancel her colonoscopy because she was supposed to drink the prep this pm and she is already so weak she cannot do it. UA normal. BP normal. Wants to know if she needs potassium. Scared to go without anything.

## 2017-04-24 NOTE — Telephone Encounter (Signed)
Pt came in and was seen in office by nurse today, potassium supplemented with f/u lab

## 2017-04-24 NOTE — ED Provider Notes (Signed)
[facility] DEPT Provider Note   CSN: [id number] Arrival date & time: 04/24/17  0754     History   Chief Complaint Chief Complaint  Patient presents with  . Weakness    HPI Dawn [name] is a 51 y.o. female.  Patient was beginning of bowel prep for colonoscopy scheduled for tomorrow. And was feeling lightheaded and dizzy without room spinning felt maybe she is a pass out. Patient also reported feeling weak and chills. But no distinctive fever. Also patient's menstrual period started Sunday. No true passing out. No severe headache. No nausea vomiting. Did have some abdominal cramping after taking the laxatives as part of the bowel prep. Did have some bowel movements.      Past Medical History:  Diagnosis Date  . Anemia   . Burning with urination 08/17/2014  . BV (bacterial vaginosis) 01/21/2014  . Fibroid 02/09/2017  . Lyme disease   . [facility] spotted fever   . Vaginal discharge 01/21/2014  . Vaginal itching 11/03/2015    Patient Active Problem List   Diagnosis Date Noted  . Absolute anemia 03/28/2017  . Positive Lyme disease serology 03/22/2017  . RMSF [facility] spotted fever) 03/22/2017  . Fibroid 02/09/2017  . Enlarged uterus 01/30/2017  . Iron deficiency anemia due to chronic blood loss 01/30/2017  . Menorrhagia with regular cycle 01/30/2017  . 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 Iron deficiency 01/19/2017  . Grief reaction 01/08/2017  . Vitamin D deficiency 12/28/2008    Past Surgical History:  Procedure Laterality Date  . CESAREAN SECTION    . TUBAL LIGATION      OB History    Gravida Para Term Preterm AB Living   2 2 2     2    SAB TAB Ectopic Multiple Live Births           2       Home Medications    Prior to Admission medications   Medication Sig Start Date End Date Taking? Authorizing Provider  docusate sodium (COLACE) 100 MG capsule Take 200 mg by mouth 2 (two) times daily.   Yes [provider]    Family  History Family History  Problem Relation Age of Onset  . Hypertension Mother   . Hypertension Father   . Alzheimer's disease Maternal Grandmother   . Cancer Maternal Grandfather        colon  . Cancer Paternal Grandfather        colon    Social History Social History  Substance Use Topics  . Smoking status: Never Smoker  . Smokeless tobacco: Never Used  . Alcohol use No     Allergies   Patient has no known allergies.   Review of Systems Review of Systems  Constitutional: Positive for fatigue.  HENT: Negative for congestion.   Eyes: Negative for visual disturbance.  Respiratory: Negative for shortness of breath.   Cardiovascular: Negative for chest pain.  Gastrointestinal: Positive for abdominal pain. Negative for blood in stool, nausea and vomiting.  Musculoskeletal: Negative for back pain.  Neurological: Positive for dizziness, weakness and light-headedness. Negative for syncope.  Hematological: Does not bruise/bleed easily.  Psychiatric/Behavioral: Negative for confusion.     Physical Exam Updated Vital Signs BP (!) 107/59   Pulse 70   Temp 99 F (37.2 C) (Oral)   Resp 16   Ht 1.651 m (5\' 5" )   Wt 68 kg (150 lb)   LMP 04/22/2017   SpO2 100%   BMI 24.96 kg/m  Physical Exam  Constitutional: She is oriented to person, place, and time. She appears well-developed and well-nourished. No distress.  HENT:  Head: Normocephalic and atraumatic.  Mouth/Throat: Oropharynx is clear and moist.  Eyes: Pupils are equal, round, and reactive to light. Conjunctivae and EOM are normal.  Neck: Normal range of motion. Neck supple.  Cardiovascular: Normal rate, regular rhythm and normal heart sounds.   Pulmonary/Chest: Effort normal and breath sounds normal. No respiratory distress.  Abdominal: Soft. Bowel sounds are normal.  Musculoskeletal: Normal range of motion. She exhibits no edema.  Neurological: She is alert and oriented to person, place, and time. No cranial nerve  deficit or sensory deficit. She exhibits normal muscle tone. Coordination normal.  Skin: Skin is warm. No rash noted.  Nursing note and vitals reviewed.    ED Treatments / Results  Labs (all labs ordered are listed, but only abnormal results are displayed) Labs Reviewed  CBC WITH DIFFERENTIAL/PLATELET - Abnormal; Notable for the following:       Result Value   RBC 5.25 (*)    Hemoglobin 11.3 (*)    MCV 69.5 (*)    MCH 21.5 (*)    RDW 16.3 (*)    All other components within normal limits  BASIC METABOLIC PANEL - Abnormal; Notable for the following:    Sodium 133 (*)    Potassium 3.2 (*)    Chloride 98 (*)    Glucose, Bld 101 (*)    Calcium 8.7 (*)    All other components within normal limits    EKG  EKG Interpretation None       ED ECG REPORT   Date: 04/24/2017  Rate: 93  Rhythm: normal sinus rhythm  QRS Axis: normal  Intervals: normal  ST/T Wave abnormalities: normal  Conduction Disutrbances:none  Narrative Interpretation:   Old EKG Reviewed: none available  I have personally reviewed the EKG tracing and agree with the computerized printout as noted.   Radiology No results found.  Procedures Procedures (including critical care time)  Medications Ordered in ED Medications  0.9 %  sodium chloride infusion (not administered)  sodium chloride 0.9 % bolus 1,000 mL (0 mLs Intravenous Stopped 04/24/17 1037)     Initial Impression / Assessment and Plan / ED Course  I have reviewed the triage vital signs and the nursing notes.  Pertinent labs & imaging results that were available during my care of the patient were reviewed by me and considered in my medical decision making (see chart for details).     Workup for the symptoms without any acute findings. Some mild mild hypokalemia but nothing serious. No significant anemia. Patient felt better after 1 L of fluid and was able to walk around without feeling like she's got pass out. No complicating factors in the  history no neuro focal deficits. No significant headache. Did not pass out.  Patient will have to decide whether she wants to continue with her bowel prep. Probably most likely would be reasonable to go ahead and postpone the colonoscopy at this point in time. She will contact her gastroenterologist. Recommend patient does stay at home: [place] take it easy today and drink plenty of fluids.  Final Clinical Impressions(s) / ED Diagnoses   Final diagnoses:  Near syncope    New Prescriptions New Prescriptions   No medications on file     Fredia Sorrow, MD 04/24/17 1105

## 2017-04-24 NOTE — Telephone Encounter (Signed)
Noted  

## 2017-04-24 NOTE — Discharge Instructions (Signed)
Return for new or worse symptoms. Return for passing out. Follow-up with your primary care doctor if symptoms do not improve. Contact your gastroenterologist regarding the colonoscopy scheduled for tomorrow.

## 2017-04-24 NOTE — Telephone Encounter (Signed)
Patient aware- potassium sent in and patient was given lab order to have rept done

## 2017-04-25 ENCOUNTER — Ambulatory Visit (HOSPITAL_COMMUNITY): Admission: RE | Admit: 2017-04-25 | Payer: 59 | Source: Ambulatory Visit | Admitting: Internal Medicine

## 2017-04-25 ENCOUNTER — Encounter (HOSPITAL_COMMUNITY): Admission: RE | Payer: Self-pay | Source: Ambulatory Visit

## 2017-04-25 LAB — POCT URINALYSIS DIPSTICK
Bilirubin, UA: NEGATIVE
Glucose, UA: NEGATIVE
KETONES UA: NEGATIVE
LEUKOCYTES UA: NEGATIVE
Nitrite, UA: NEGATIVE
PH UA: 7.5 (ref 5.0–8.0)
PROTEIN UA: NEGATIVE
SPEC GRAV UA: 1.015 (ref 1.010–1.025)
UROBILINOGEN UA: 0.2 U/dL

## 2017-04-25 SURGERY — EGD (ESOPHAGOGASTRODUODENOSCOPY)
Anesthesia: Moderate Sedation

## 2017-04-25 NOTE — Progress Notes (Signed)
Advised urine normal.Blood was due to menstral cycle

## 2017-04-26 ENCOUNTER — Ambulatory Visit (INDEPENDENT_AMBULATORY_CARE_PROVIDER_SITE_OTHER): Payer: 59 | Admitting: Family Medicine

## 2017-04-26 ENCOUNTER — Encounter (INDEPENDENT_AMBULATORY_CARE_PROVIDER_SITE_OTHER): Payer: Self-pay | Admitting: *Deleted

## 2017-04-26 ENCOUNTER — Other Ambulatory Visit (INDEPENDENT_AMBULATORY_CARE_PROVIDER_SITE_OTHER): Payer: Self-pay | Admitting: *Deleted

## 2017-04-26 ENCOUNTER — Encounter: Payer: Self-pay | Admitting: Family Medicine

## 2017-04-26 VITALS — BP 130/80 | HR 84 | Temp 99.0°F | Ht 65.0 in | Wt 150.0 lb

## 2017-04-26 DIAGNOSIS — E876 Hypokalemia: Secondary | ICD-10-CM

## 2017-04-26 DIAGNOSIS — Z207 Contact with and (suspected) exposure to pediculosis, acariasis and other infestations: Secondary | ICD-10-CM

## 2017-04-26 DIAGNOSIS — Z09 Encounter for follow-up examination after completed treatment for conditions other than malignant neoplasm: Secondary | ICD-10-CM

## 2017-04-26 DIAGNOSIS — D500 Iron deficiency anemia secondary to blood loss (chronic): Secondary | ICD-10-CM | POA: Diagnosis not present

## 2017-04-26 DIAGNOSIS — D508 Other iron deficiency anemias: Secondary | ICD-10-CM

## 2017-04-26 LAB — BASIC METABOLIC PANEL
BUN: 9 mg/dL (ref 7–25)
CALCIUM: 9.9 mg/dL (ref 8.6–10.2)
CO2: 30 mmol/L (ref 20–32)
Chloride: 103 mmol/L (ref 98–110)
Creat: 0.75 mg/dL (ref 0.50–1.10)
GLUCOSE: 91 mg/dL (ref 65–99)
Potassium: 5.8 mmol/L — ABNORMAL HIGH (ref 3.5–5.3)
SODIUM: 139 mmol/L (ref 135–146)

## 2017-04-26 NOTE — Patient Instructions (Addendum)
F?U as before, call iof you need me sooner  You need to STOP potassium tablets, totally ,and any EXTRA potassium  For next several days  Repeat chem 7 non fasting next week Tuesday    You need to arrange with Dr Horace Porteous office regarding the colonoscopy and how to prepare due to severe adverse response to dulcolax and inability to do test this week as planned  I will be referring you to Dr Tommy Medal , infectious disease in [place], the office will call you

## 2017-04-28 DIAGNOSIS — Z09 Encounter for follow-up examination after completed treatment for conditions other than malignant neoplasm: Secondary | ICD-10-CM | POA: Insufficient documentation

## 2017-04-28 NOTE — Progress Notes (Signed)
Dawn [name]     MRN: [id number]      DOB: 10-Oct-1965   HPI Ms. Storck is here with her Mother and sister for f/u from recent ED visit on 04/24/2017 She had to go emergently to the ED after 2 dulcolax tablets which she took when starting to prepare for a scheduled colonoscopy.states she felt light headed as though she would pass out, also experienced palpitations, now c/o excessive Her baseline labs showed mild hypokalemia and she was not anemic, her EKG was normal. She had called /walked into the office the following day , nurse evaluated her , I ordered supplemental potassium with 2 day repeat lab, she is in today. Still c/o feeling weak, wants to review what happened with her , and unsure what she will take for bowel prep and her colonoscopy  which I am deferring to her GI Doc At this visit, the discussion regarding the fact that she recently learned that her otherwise healthy spouse had died at a young age by parasitic infection per autopsy reporting and that  she was recently dx with and treated for both Lyme and RMSF, and that I had discussed her seeing an iD Specialist for any questions she may have as to any potential tests or personal risks to be answered will be done at this time, she was in agreememnt  I again asked about her emotional and mental health as she is clearly somewhat anxious at this visit, she and family state she geenrally is doing well and that  she has their  supoport  ROS Denies recent fever or chills. Denies sinus pressure, nasal congestion, ear pain or sore throat. Denies chest congestion, productive cough or wheezing. Denies chest pains, palpitations and leg swelling .   Denies dysuria, frequency, hesitancy or incontinence. Denies joint pain, swelling and limitation in mobility. Denies headaches, seizures, numbness, or tingling. C/o mild insomnia but desires no medication. Denies skin break down or rash.   PE  BP 130/80 (BP Location: Left Arm, Patient  Position: Sitting, Cuff Size: Normal)   Pulse 84   Temp 99 F (37.2 C)   Ht 5\' 5"  (1.651 m)   Wt 150 lb (68 kg)   LMP 04/22/2017   SpO2 99%   BMI 24.96 kg/m   Patient alert and oriented and in no cardiopulmonary distress. Anxioius HEENT: No facial asymmetry, EOMI,   oropharynx pink and moist.  Neck supple no JVD, no mass.  Chest: Clear to auscultation bilaterally.  CVS: S1, S2 no murmurs, no S3.Regular rate.  ABD: Soft non tender.   Ext: No edema  MS: Adequate ROM spine, shoulders, hips and knees.  Skin: Intact, no ulcerations or rash noted.  Psych: Good eye contact, normal affect. Memory intact not  depressed appearing.  CNS: CN 2-12 intact, power,  normal throughout.no focal deficits noted.   Assessment & Plan  Encounter for examination following treatment at hospital Excess response to dulcolax resulting in excessive cramping and fatigue requirng ED visit. Pt was mildly hypokalemic now over corrected between tablets and excess oral intake. Sill somewhat anxious. Discontinue potassium supplements, and discuss colon Prep with GI  Exposure to parasitic disease consultation request to ID for this pt who reports that her previously healthy spouse was diagnosed at autopsy with disseminated parasitic disease as his cause of death earlier ths year, she got the report approx 4 months after his death. She is concerned that she too may be infected. She has had stool testing done for  O/P which is negative , she has severe IDA felt primarily to be due to heavy menses also has relatively low B12 , recently had both RMSF and lyme disease Will refer to ID for for formal consultation  Absolute anemia Improving with oral iron supplement

## 2017-04-29 NOTE — Assessment & Plan Note (Signed)
Excess response to dulcolax resulting in excessive cramping and fatigue requirng ED visit. Pt was mildly hypokalemic now over corrected between tablets and excess oral intake. Sill somewhat anxious. Discontinue potassium supplements, and discuss colon Prep with GI

## 2017-04-30 DIAGNOSIS — Z207 Contact with and (suspected) exposure to pediculosis, acariasis and other infestations: Secondary | ICD-10-CM | POA: Insufficient documentation

## 2017-04-30 NOTE — Assessment & Plan Note (Signed)
consultation request to ID for this pt who reports that her previously healthy spouse was diagnosed at autopsy with disseminated parasitic disease as his cause of death earlier ths year, she got the report approx 4 months after his death. She is concerned that she too may be infected. She has had stool testing done for O/P which is negative , she has severe IDA felt primarily to be due to heavy menses also has relatively low B12 , recently had both RMSF and lyme disease Will refer to ID for for formal consultation

## 2017-04-30 NOTE — Telephone Encounter (Signed)
Noted. She will have to be rescheduled. She can be given prep in day hospital with IV fluids running

## 2017-04-30 NOTE — Assessment & Plan Note (Signed)
Improving with oral iron supplement

## 2017-05-02 ENCOUNTER — Encounter: Payer: Self-pay | Admitting: Family Medicine

## 2017-05-02 LAB — COMPREHENSIVE METABOLIC PANEL
ALBUMIN: 4.1 g/dL (ref 3.6–5.1)
ALT: 8 U/L (ref 6–29)
AST: 12 U/L (ref 10–35)
Alkaline Phosphatase: 54 U/L (ref 33–115)
BUN: 14 mg/dL (ref 7–25)
CO2: 27 mmol/L (ref 20–32)
CREATININE: 0.66 mg/dL (ref 0.50–1.10)
Calcium: 9 mg/dL (ref 8.6–10.2)
Chloride: 103 mmol/L (ref 98–110)
GLUCOSE: 76 mg/dL (ref 65–99)
Potassium: 4.2 mmol/L (ref 3.5–5.3)
SODIUM: 138 mmol/L (ref 135–146)
Total Bilirubin: 0.6 mg/dL (ref 0.2–1.2)
Total Protein: 7.2 g/dL (ref 6.1–8.1)

## 2017-05-21 ENCOUNTER — Encounter: Payer: Self-pay | Admitting: Infectious Disease

## 2017-05-21 ENCOUNTER — Ambulatory Visit (INDEPENDENT_AMBULATORY_CARE_PROVIDER_SITE_OTHER): Payer: 59 | Admitting: Infectious Disease

## 2017-05-21 VITALS — BP 127/80 | HR 65 | Temp 98.0°F | Wt 153.0 lb

## 2017-05-21 DIAGNOSIS — Z9189 Other specified personal risk factors, not elsewhere classified: Secondary | ICD-10-CM | POA: Insufficient documentation

## 2017-05-21 DIAGNOSIS — Z113 Encounter for screening for infections with a predominantly sexual mode of transmission: Secondary | ICD-10-CM | POA: Diagnosis not present

## 2017-05-21 DIAGNOSIS — F419 Anxiety disorder, unspecified: Secondary | ICD-10-CM

## 2017-05-21 HISTORY — DX: Anxiety disorder, unspecified: F41.9

## 2017-05-21 HISTORY — DX: Other specified personal risk factors, not elsewhere classified: Z91.89

## 2017-05-21 NOTE — Progress Notes (Signed)
Reason for Consult: Concern for exposure to parasite  Requesting Physician: Dr. Moshe Cipro   Subjective:    Patient ID: Dawn [name], female    DOB: [birth year], 51 y.o.   MRN: [id number]  HPI   51 year old African American lady who is healthy with no significant past medical history, on no medications, hx of C section with prior pregnancies presents today after referral from PCP with concern for parasitic infection.  The patient's husband in fact passed away at [facility] and she brought the autopsy report.   He apparently had  Stongyloides hyperinfection syndrome with eosinophilic cholecystitis and sepsis from bacteria from the strongyloides infection.   [name] is herself concerned that she might might have this infection.  Husband grew up in [place]. He worked in tobacco making cigarettes.   Marlean grew up in [place]. She has travelled to the [place], [place] with her husband prior to husband's first admission with August 2017 with high wbc, malaise. She and her husband also since travelled to [place]  prior to his hospitalization.  Husband was NOT a Ecologist and no clear cut occupational hazard where he would have had exposure of bare feet to potentially infected soil--though the [place] does have more strongyloides than other areas of the country.  She herself has had no symptoms other than grieving loss of her husband and some anxiety.  She did have a rash on arms and legs and on her neck after a tick bite and was given diagnosis of Lyme and RMSF based on serologies, though she really would not have seroconverted for EITHER in the 3 days since her bite and her being seen in an ED. She was given 14 days of doxycyline and done well.  She is NOT sexually active since death of her husband. She tested negative for HIV in May of 2018.  She does not have tattoos or hx of IVDU and neither did husband.  She had stool O and P that was negative. She does NOT have  eosinophilia.  Past Medical History:  Diagnosis Date  . Anemia   . Anxiety 05/21/2017  . At risk for infectious disease due to recent foreign travel 05/21/2017  . Burning with urination 08/17/2014  . BV (bacterial vaginosis) 01/21/2014  . Fibroid 02/09/2017  . Lyme disease   . [facility] spotted fever   . Vaginal discharge 01/21/2014  . Vaginal itching 11/03/2015    Past Surgical History:  Procedure Laterality Date  . CESAREAN SECTION    . TUBAL LIGATION      Family History  Problem Relation Age of Onset  . Hypertension Mother   . Hypertension Father   . Alzheimer's disease Maternal Grandmother   . Cancer Maternal Grandfather        colon  . Cancer Paternal Grandfather        colon      Social History   Social History  . Marital status: Widowed    Spouse name: N/A  . Number of children: N/A  . Years of education: N/A   Occupational History  . cosmetologist [employer]   Social History Main Topics  . Smoking status: Never Smoker  . Smokeless tobacco: Never Used  . Alcohol use No  . Drug use: No  . Sexual activity: Not Currently    Birth control/ protection: Surgical     Comment: tubal   Other Topics Concern  . Not on file   Social History Narrative  . No  narrative on file    No Known Allergies   Current Outpatient Prescriptions:  .  docusate sodium (COLACE) 100 MG capsule, Take 200 mg by mouth 2 (two) times daily., Disp: , Rfl:  .  potassium chloride SA (K-DUR,KLOR-CON) 20 MEQ tablet, Take 1 tablet (20 mEq total) by mouth 3 (three) times daily. (Patient not taking: Reported on 05/21/2017), Disp: 15 tablet, Rfl: 0      Review of Systems  Constitutional: Negative for activity change, chills and fever.  HENT: Negative for congestion and sore throat.   Eyes: Negative for photophobia.  Respiratory: Negative for cough, shortness of breath and wheezing.   Cardiovascular: Negative for chest pain, palpitations and leg swelling.   Gastrointestinal: Negative for abdominal distention, abdominal pain, blood in stool, constipation, diarrhea, nausea and vomiting.  Genitourinary: Negative for decreased urine volume, dysuria, flank pain, hematuria, menstrual problem, pelvic pain, urgency and vaginal discharge.  Musculoskeletal: Negative for back pain and myalgias.  Skin: Negative for color change, pallor, rash and wound.  Neurological: Negative for dizziness, weakness and headaches.  Hematological: Does not bruise/bleed easily.  Psychiatric/Behavioral: Negative for suicidal ideas.       Objective:   Physical Exam  Constitutional: She is oriented to person, place, and time. She appears well-developed and well-nourished. No distress.  HENT:  Head: Normocephalic and atraumatic.  Mouth/Throat: No oropharyngeal exudate.  Eyes: Conjunctivae and EOM are normal. No scleral icterus.  Neck: Normal range of motion. Neck supple. No JVD present. No tracheal deviation present. No thyromegaly present.  Cardiovascular: Normal rate, regular rhythm and normal heart sounds.  Exam reveals no gallop and no friction rub.   No murmur heard. Pulmonary/Chest: Effort normal and breath sounds normal. No respiratory distress. She has no wheezes. She has no rales. She exhibits no tenderness.  Abdominal: Bowel sounds are normal. She exhibits no distension and no mass. There is no tenderness. There is no rebound.  Musculoskeletal: She exhibits no edema or tenderness.  Lymphadenopathy:    She has no cervical adenopathy.  Neurological: She is alert and oriented to person, place, and time. She exhibits normal muscle tone. Coordination normal.  Skin: Skin is warm and dry. No rash noted. She is not diaphoretic. No erythema. No pallor.  Psychiatric: She has a normal mood and affect. Her behavior is normal. Judgment and thought content normal.          Assessment & Plan:   Potential for infection from foreign travel (or to local site): she is  concerned that she could have same infection as her husband. While this would NOT have been transmissible sexually it is POSSIBLE that [place] were exposed to a common source such as a sandy beach in the [place] that might have harbored the Strongyloides.  This parasite is commonly missed on O and P and in fact better way on culture is to look for track marks of migrating organisms on field of bacteria culture.  We will check serology for Strongyloides. I will also check HTLV1 since if it is positive that might portent more difficulty with treating the strongyloides  I spent greater than 55 minutes with the patient including greater than 50% of time in face to face counsel of the patient re the nature of strongyloides, its life cycle how infection and auto-infection occur, how we would treat this if positive and in coordination of her care.   Lavell Islam. Tommy Medal, M.D.

## 2017-05-28 LAB — STRONGYLOIDES ANTIBODY: Strongyloides IgG Antibody, ELISA: NEGATIVE

## 2017-05-28 LAB — HTLV I+II ANTIBODIES, (EIA), BLD: HTLV I/II ANTIBODY: NONREACTIVE

## 2017-05-29 ENCOUNTER — Telehealth: Payer: Self-pay | Admitting: *Deleted

## 2017-05-29 NOTE — Telephone Encounter (Signed)
Thanks Jackie 

## 2017-05-29 NOTE — Telephone Encounter (Signed)
Patient notified of results. Dawn [name]

## 2017-05-29 NOTE — Telephone Encounter (Signed)
-----   Message from Truman Hayward, MD sent at 05/29/2017  7:40 AM EDT ----- paitent DOES NOT have evidence by antibody testing for strongyloides (infection her husband died from) nor of HTLV1 or 2

## 2017-06-28 ENCOUNTER — Encounter (INDEPENDENT_AMBULATORY_CARE_PROVIDER_SITE_OTHER): Payer: Self-pay | Admitting: *Deleted

## 2017-07-09 ENCOUNTER — Ambulatory Visit: Payer: Commercial Managed Care - HMO | Admitting: Family Medicine

## 2017-09-27 ENCOUNTER — Ambulatory Visit (HOSPITAL_COMMUNITY)
Admission: RE | Admit: 2017-09-27 | Discharge: 2017-09-27 | Disposition: A | Discharge status: Home / Self Care | Payer: 59 | Source: Ambulatory Visit | Attending: Internal Medicine | Admitting: Internal Medicine

## 2017-09-27 ENCOUNTER — Other Ambulatory Visit: Payer: Self-pay

## 2017-09-27 ENCOUNTER — Encounter (HOSPITAL_COMMUNITY)
Admission: RE | Disposition: A | Discharge status: Home / Self Care | Payer: Self-pay | Source: Ambulatory Visit | Attending: Internal Medicine

## 2017-09-27 ENCOUNTER — Encounter (HOSPITAL_COMMUNITY): Payer: Self-pay | Admitting: *Deleted

## 2017-09-27 DIAGNOSIS — D509 Iron deficiency anemia, unspecified: Secondary | ICD-10-CM | POA: Insufficient documentation

## 2017-09-27 DIAGNOSIS — N920 Excessive and frequent menstruation with regular cycle: Secondary | ICD-10-CM | POA: Diagnosis not present

## 2017-09-27 DIAGNOSIS — K644 Residual hemorrhoidal skin tags: Secondary | ICD-10-CM | POA: Diagnosis not present

## 2017-09-27 DIAGNOSIS — F419 Anxiety disorder, unspecified: Secondary | ICD-10-CM | POA: Insufficient documentation

## 2017-09-27 DIAGNOSIS — Z800 Family history of malignant neoplasm of digestive organs: Secondary | ICD-10-CM | POA: Insufficient documentation

## 2017-09-27 DIAGNOSIS — Z79899 Other long term (current) drug therapy: Secondary | ICD-10-CM | POA: Diagnosis not present

## 2017-09-27 DIAGNOSIS — D649 Anemia, unspecified: Secondary | ICD-10-CM | POA: Insufficient documentation

## 2017-09-27 DIAGNOSIS — K5909 Other constipation: Secondary | ICD-10-CM | POA: Diagnosis not present

## 2017-09-27 DIAGNOSIS — D508 Other iron deficiency anemias: Secondary | ICD-10-CM

## 2017-09-27 HISTORY — PX: ESOPHAGOGASTRODUODENOSCOPY: SHX5428

## 2017-09-27 HISTORY — PX: COLONOSCOPY: SHX5424

## 2017-09-27 LAB — HEMOGLOBIN AND HEMATOCRIT, BLOOD
HEMATOCRIT: 32.1 % — AB (ref 36.0–46.0)
HEMOGLOBIN: 9.3 g/dL — AB (ref 12.0–15.0)

## 2017-09-27 SURGERY — EGD (ESOPHAGOGASTRODUODENOSCOPY)
Anesthesia: Moderate Sedation

## 2017-09-27 MED ORDER — MIDAZOLAM HCL 5 MG/5ML IJ SOLN
INTRAMUSCULAR | Status: AC
  Filled 2017-09-27: qty 10

## 2017-09-27 MED ORDER — STERILE WATER FOR IRRIGATION IR SOLN
Status: DC | PRN
  Administered 2017-09-27: 2.5 mL

## 2017-09-27 MED ORDER — FLINTSTONES PLUS IRON PO CHEW: 1.0000 | CHEWABLE_TABLET | Freq: Two times a day (BID) | ORAL | Status: DC

## 2017-09-27 MED ORDER — MIDAZOLAM HCL 5 MG/5ML IJ SOLN
INTRAMUSCULAR | Status: AC
  Filled 2017-09-27: qty 5

## 2017-09-27 MED ORDER — SODIUM CHLORIDE 0.9 % IV SOLN
INTRAVENOUS | Status: DC
  Administered 2017-09-27: 08:00:00 via INTRAVENOUS

## 2017-09-27 MED ORDER — MEPERIDINE HCL 50 MG/ML IJ SOLN
INTRAMUSCULAR | Status: AC
  Filled 2017-09-27: qty 1

## 2017-09-27 MED ORDER — MIDAZOLAM HCL 5 MG/5ML IJ SOLN
INTRAMUSCULAR | Status: DC | PRN
  Administered 2017-09-27 (×4): 2 mg via INTRAVENOUS

## 2017-09-27 MED ORDER — LIDOCAINE VISCOUS 2 % MT SOLN
OROMUCOSAL | Status: AC
  Filled 2017-09-27: qty 15

## 2017-09-27 MED ORDER — LIDOCAINE VISCOUS 2 % MT SOLN
OROMUCOSAL | Status: DC | PRN
  Administered 2017-09-27: 1 via OROMUCOSAL

## 2017-09-27 MED ORDER — MEPERIDINE HCL 50 MG/ML IJ SOLN
INTRAMUSCULAR | Status: DC | PRN
  Administered 2017-09-27 (×2): 25 mg via INTRAVENOUS

## 2017-09-27 NOTE — H&P (Signed)
Dawn [name] is an 52 y.o. female.   Chief Complaint: Patient is here for EGD and colonoscopy. HPI: Patient is 52 year old female who was discovered to have iron deficiency last year and hemoglobin improved from 8.8 to over 11 g.  He was scheduled for endoscopic evaluation a few months ago but had to be postponed.  He has chronic constipation.  She generally has 2 bowel movements per week.  She denies melena or rectal bleeding abdominal pain nausea vomiting or heartburn.  She has heavy periods. Family history is negative for CRC or IBD.  Past Medical History:  Diagnosis Date  . IDA   . Anxiety 05/21/2017  .  05/21/2017  . Burning with urination 08/17/2014  . BV (bacterial vaginosis) 01/21/2014  . 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 Lyme disease in 2017   . [facility] spotted fever in 2017   .  01/21/2014  .  11/03/2015    Past Surgical History:  Procedure Laterality Date  . CESAREAN SECTION    . TUBAL LIGATION      Family History  Problem Relation Age of Onset  . Hypertension Mother   . Hypertension Father   . Alzheimer's disease Maternal Grandmother   . Cancer Maternal Grandfather        colon  . Colon cancer Maternal Grandfather   . Cancer Paternal Grandfather        colon  . Colon cancer Paternal Grandfather    Social History:  reports that  has never smoked. she has never used smokeless tobacco. She reports that she does not drink alcohol or use drugs.  Allergies: No Known Allergies  Medications Prior to Admission  Medication Sig Dispense Refill  . potassium chloride SA (K-DUR,KLOR-CON) 20 MEQ tablet Take 1 tablet (20 mEq total) by mouth 3 (three) times daily. (Patient not taking: Reported on 05/21/2017) 15 tablet 0    No results found for this or any previous visit (from the past 48 hour(s)). No results found.  ROS  Blood pressure 129/65, pulse 95, temperature 98.9 F (37.2 C), temperature source Oral, resp. rate 18, height 5\' 5"  (1.651 m), weight 150 lb (68 kg),  last menstrual period 09/13/2017, SpO2 100 %. Physical Exam  Constitutional: She appears well-developed and well-nourished.  HENT:  Mouth/Throat: Oropharynx is clear and moist.  Eyes: Conjunctivae are normal. No scleral icterus.  Neck: No thyromegaly present.  Cardiovascular: Normal rate, regular rhythm and normal heart sounds.  No murmur heard. Respiratory: Effort normal and breath sounds normal.  GI: Soft. She exhibits no distension and no mass. There is no tenderness.  Musculoskeletal: She exhibits no edema.  Lymphadenopathy:    She has no cervical adenopathy.  Neurological: She is alert.  Skin: Skin is warm and dry.     Assessment/Plan Iron deficiency anemia. Diagnostic EGD and colonoscopy.  Hildred Laser, MD 09/27/2017, 8:18 AM

## 2017-09-27 NOTE — Op Note (Signed)
[facility] Patient Name: Dawn [name] Procedure Date: 09/27/2017 8:38 AM MRN: [id number] Date of Birth: [birth year] Attending MD: Hildred Laser , MD CSN: [id number] Age: 52 Admit Type: Outpatient Procedure:                Colonoscopy Indications:              Unexplained iron deficiency anemia Providers:                Hildred Laser, MD, Lurline Del, RN, Nelma Rothman,                            Technician Referring MD:             Norwood Levo. Moshe Cipro, MD Medicines:                Midazolam 2 mg IV Complications:            No immediate complications. Estimated Blood Loss:     Estimated blood loss: none. Procedure:                Pre-Anesthesia Assessment:                           - Prior to the procedure, a History and Physical                            was performed, and patient medications and                            allergies were reviewed. The patient's tolerance of                            previous anesthesia was also reviewed. The risks                            and benefits of the procedure and the sedation                            options and risks were discussed with the patient.                            All questions were answered, and informed consent                            was obtained. Prior Anticoagulants: The patient has                            taken no previous anticoagulant or antiplatelet                            agents. ASA Grade Assessment: I - A normal, healthy                            patient. After reviewing the risks and benefits,  the patient was deemed in satisfactory condition to                            undergo the procedure.                           After obtaining informed consent, the colonoscope                            was passed under direct vision. Throughout the                            procedure, the patient's blood pressure, pulse, and                            oxygen saturations were  monitored continuously. The                            [id number] ([id number]) scope was introduced through                            the anus and advanced to the the terminal ileum,                            with identification of the appendiceal orifice and                            IC valve. The colonoscopy was performed without                            difficulty. The patient tolerated the procedure                            well. The quality of the bowel preparation was                            excellent. The terminal ileum, ileocecal valve,                            appendiceal orifice, and rectum were photographed. Scope In: 8:43:04 AM Scope Out: 9:02:16 AM Scope Withdrawal Time: 0 hours 12 minutes 31 seconds  Total Procedure Duration: 0 hours 19 minutes 12 seconds  Findings:      The perianal and digital rectal examinations were normal.      The terminal ileum appeared normal.      The colon (entire examined portion) appeared normal.      External hemorrhoids were found during retroflexion. The hemorrhoids       were small. Impression:               - The examined portion of the ileum was normal.                           - The entire examined colon is normal.                           -  External hemorrhoids.                           - No specimens collected. Moderate Sedation:      Moderate (conscious) sedation was administered by the endoscopy nurse       and supervised by the endoscopist. The following parameters were       monitored: oxygen saturation, heart rate, blood pressure, CO2       capnography and response to care. Total physician intraservice time was       26 minutes. Recommendation:           - Patient has a contact number available for                            emergencies. The signs and symptoms of potential                            delayed complications were discussed with the                            patient. Return to normal activities tomorrow.                             Written discharge instructions were provided to the                            patient.                           - Resume previous diet today.                           - Continue present medications.                           - Repeat colonoscopy in 10 years for screening                            purposes.                           - H/H today.                           _ Flintstones chewable bid. Procedure Code(s):        --- Professional ---                           [phone], Colonoscopy, flexible; diagnostic, including                            collection of specimen(s) by brushing or washing,                            when performed (separate procedure)                           99152,  Moderate sedation services provided by the                            same physician or other qualified health care                            professional performing the diagnostic or                            therapeutic service that the sedation supports,                            requiring the presence of an independent trained                            observer to assist in the monitoring of the                            patient's level of consciousness and physiological                            status; initial 15 minutes of intraservice time,                            patient age 88 years or older                           [phone], Moderate sedation services; each additional                            15 minutes intraservice time Diagnosis Code(s):        --- Professional ---                           K64.4, Residual hemorrhoidal skin tags                           D50.9, Iron deficiency anemia, unspecified CPT copyright 2016 American Medical Association. All rights reserved. The codes documented in this report are preliminary and upon coder review may  be revised to meet current compliance requirements. Hildred Laser, MD Hildred Laser, MD 09/27/2017 9:30:08  AM This report has been signed electronically. Number of Addenda: 0

## 2017-09-27 NOTE — Op Note (Signed)
[facility] Patient Name: Dawn [name] Procedure Date: 09/27/2017 8:06 AM MRN: [id number] Date of Birth: 12-28-1965 Attending MD: Hildred Laser , MD CSN: [id number] Age: 52 Admit Type: Outpatient Procedure:                Upper GI endoscopy Indications:              Unexplained iron deficiency anemia Providers:                Hildred Laser, MD, Lurline Del, RN, Nelma Rothman,                            Technician Referring MD:             Norwood Levo. Moshe Cipro, MD Medicines:                Lidocaine spray, Meperidine 50 mg IV, Midazolam 10                            mg IV Complications:            No immediate complications. Estimated Blood Loss:     Estimated blood loss: none. Procedure:                Pre-Anesthesia Assessment:                           - Prior to the procedure, a History and Physical                            was performed, and patient medications and                            allergies were reviewed. The patient's tolerance of                            previous anesthesia was also reviewed. The risks                            and benefits of the procedure and the sedation                            options and risks were discussed with the patient.                            All questions were answered, and informed consent                            was obtained. Prior Anticoagulants: The patient has                            taken no previous anticoagulant or antiplatelet                            agents. ASA Grade Assessment: I - A normal, healthy  patient. After reviewing the risks and benefits,                            the patient was deemed in satisfactory condition to                            undergo the procedure.                           After obtaining informed consent, the endoscope was                            passed under direct vision. Throughout the                            procedure, the patient's blood  pressure, pulse, and                            oxygen saturations were monitored continuously. The                            [id number] ([id number]) scope was introduced through the                            mouth, and advanced to the second part of duodenum.                            The upper GI endoscopy was accomplished without                            difficulty. The patient tolerated the procedure                            well. Scope In: 8:32:14 AM Scope Out: 8:36:39 AM Total Procedure Duration: 0 hours 4 minutes 25 seconds  Findings:      The examined esophagus was normal.      The Z-line was regular and was found 40 cm from the incisors.      The entire examined stomach was normal.      The duodenal bulb and second portion of the duodenum were normal. Impression:               - Normal esophagus.                           - Z-line regular, 40 cm from the incisors.                           - Normal stomach.                           - Normal duodenal bulb and second portion of the                            duodenum.                           -  No specimens collected. Moderate Sedation:      Moderate (conscious) sedation was administered by the endoscopy nurse       and supervised by the endoscopist. The following parameters were       monitored: oxygen saturation, heart rate, blood pressure, CO2       capnography and response to care. Total physician intraservice time was       12 minutes. Recommendation:           - Patient has a contact number available for                            emergencies. The signs and symptoms of potential                            delayed complications were discussed with the                            patient. Return to normal activities tomorrow.                            Written discharge instructions were provided to the                            patient.                           - Resume previous diet today.                            - Continue present medications.                           - See the other procedure note for documentation of                            additional recommendations. Procedure Code(s):        --- Professional ---                           [phone], Esophagogastroduodenoscopy, flexible,                            transoral; diagnostic, including collection of                            specimen(s) by brushing or washing, when performed                            (separate procedure)                           99152, Moderate sedation services provided by the                            same physician or other qualified health care  professional performing the diagnostic or                            therapeutic service that the sedation supports,                            requiring the presence of an independent trained                            observer to assist in the monitoring of the                            patient's level of consciousness and physiological                            status; initial 15 minutes of intraservice time,                            patient age 19 years or older Diagnosis Code(s):        --- Professional ---                           D50.9, Iron deficiency anemia, unspecified CPT copyright 2016 American Medical Association. All rights reserved. The codes documented in this report are preliminary and upon coder review may  be revised to meet current compliance requirements. Hildred Laser, MD Hildred Laser, MD 09/27/2017 9:27:04 AM This report has been signed electronically. Number of Addenda: 0

## 2017-09-27 NOTE — Discharge Instructions (Addendum)
Flintstone chewable with iron 1 tablet twice daily. High fiber diet. No driving for 24 hours. Next screening colonoscopy in 10 years.

## 2017-10-01 ENCOUNTER — Encounter (HOSPITAL_COMMUNITY): Payer: Self-pay | Admitting: Internal Medicine

## 2018-02-27 ENCOUNTER — Ambulatory Visit: Payer: 59 | Admitting: Family Medicine

## 2018-03-05 ENCOUNTER — Ambulatory Visit: Payer: 59 | Admitting: Family Medicine

## 2018-03-26 ENCOUNTER — Encounter: Payer: Self-pay | Admitting: Family Medicine

## 2018-03-26 ENCOUNTER — Other Ambulatory Visit: Payer: Self-pay

## 2018-03-26 ENCOUNTER — Ambulatory Visit: Payer: 59 | Admitting: Family Medicine

## 2018-03-26 VITALS — BP 114/58 | HR 74 | Ht 65.0 in | Wt 154.1 lb

## 2018-03-26 DIAGNOSIS — M549 Dorsalgia, unspecified: Secondary | ICD-10-CM

## 2018-03-26 DIAGNOSIS — Z1231 Encounter for screening mammogram for malignant neoplasm of breast: Secondary | ICD-10-CM | POA: Diagnosis not present

## 2018-03-26 DIAGNOSIS — E559 Vitamin D deficiency, unspecified: Secondary | ICD-10-CM

## 2018-03-26 DIAGNOSIS — D500 Iron deficiency anemia secondary to blood loss (chronic): Secondary | ICD-10-CM | POA: Diagnosis not present

## 2018-03-26 DIAGNOSIS — E611 Iron deficiency: Secondary | ICD-10-CM | POA: Diagnosis not present

## 2018-03-26 NOTE — Patient Instructions (Addendum)
F/U in 6.5 months, call if you need me before  Please schedule mammogram on a Monday or Tuesday for the patient at checkout   Lab today, CBC, iron and ferritin  Please commit to regular exercise, 30 mins / day and improved sleep but thankful as to how far you have come

## 2018-03-26 NOTE — Assessment & Plan Note (Signed)
Need to recheck level, still has heavy cycles

## 2018-03-26 NOTE — Progress Notes (Signed)
   Dawn [name]     MRN: [id number]      DOB: June 19, 1966   HPI Dawn [name] is here for follow up and re-evaluation of chronic medical conditions, medication management and review of any available recent lab and radiology data.  Preventive health is updated, specifically  Cancer screening and Immunization.   Questions or concerns regarding consultations or procedures which the PT has had in the interim are  addressed. The PT denies any adverse reactions to current medications since the last visit.  2 week h/o neck and upper back pain on right side around the end of June, aggravated by turning to the right or taking  A deep breath or lifting the arm   ROS Denies recent fever or chills. Denies sinus pressure, nasal congestion, ear pain or sore throat. Denies chest congestion, productive cough or wheezing. Denies chest pains, palpitations and leg swelling Denies abdominal pain, nausea, vomiting,diarrhea or constipation.   Denies dysuria, frequency, hesitancy or incontinence.  Denies headaches, seizures, numbness, or tingling. Denies depression, anxiety or insomnia. Denies skin break down or rash.   PE  BP (!) 114/58 (BP Location: Left Arm, Patient Position: Sitting, Cuff Size: Normal)   Pulse 74   Ht 5\' 5"  (1.651 m)   Wt 154 lb 1.9 oz (69.9 kg)   SpO2 99%   BMI 25.65 kg/m   Patient alert and oriented and in no cardiopulmonary distress.  HEENT: No facial asymmetry, EOMI,   oropharynx pink and moist.  Neck supple no JVD, no mass.  Chest: Clear to auscultation bilaterally.No reporoducible chest wall pain, anterior or posterior CVS: S1, S2 no murmurs, no S3.Regular rate.  ABD: Soft non tender.   Ext: No edema  MS: Adequate ROM spine, shoulders, hips and knees.  Skin: Intact, no ulcerations or rash noted.  Psych: Good eye contact, normal affect. Memory intact not anxious or depressed appearing.  CNS: CN 2-12 intact, power,  normal throughout.no focal deficits  noted.   Assessment & Plan  Iron deficiency Need to recheck level, still has heavy cycles  Iron deficiency anemia due to chronic blood loss Updated lab needed and  obtained, she needs to supplement her iron Still reports heavy bleeding for 2 to 3 days / cycle, no interest in definitive management, hoping to enter menopause soon  Vitamin D deficiency Needs supplementing , will rept in 6 months  Upper back pain on right side present approximately 3 weeks prior to appt, musculoskeletal in origin, resolved by the time pt seen in the office

## 2018-03-27 ENCOUNTER — Encounter: Payer: Self-pay | Admitting: Family Medicine

## 2018-03-27 LAB — IRON: Iron: 12 ug/dL — ABNORMAL LOW (ref 45–160)

## 2018-03-27 LAB — FERRITIN: Ferritin: 4 ng/mL — ABNORMAL LOW (ref 16–232)

## 2018-03-27 LAB — CBC
HCT: 30.6 % — ABNORMAL LOW (ref 35.0–45.0)
Hemoglobin: 8.7 g/dL — ABNORMAL LOW (ref 11.7–15.5)
MCH: 18.5 pg — ABNORMAL LOW (ref 27.0–33.0)
MCHC: 28.4 g/dL — ABNORMAL LOW (ref 32.0–36.0)
MCV: 65 fL — AB (ref 80.0–100.0)
PLATELETS: 307 10*3/uL (ref 140–400)
RBC: 4.71 10*6/uL (ref 3.80–5.10)
RDW: 18.4 % — AB (ref 11.0–15.0)
WBC: 3.2 10*3/uL — ABNORMAL LOW (ref 3.8–10.8)

## 2018-04-01 ENCOUNTER — Ambulatory Visit (HOSPITAL_COMMUNITY)
Admission: RE | Admit: 2018-04-01 | Discharge: 2018-04-01 | Disposition: A | Discharge status: Home / Self Care | Payer: 59 | Source: Ambulatory Visit | Attending: Family Medicine | Admitting: Family Medicine

## 2018-04-01 DIAGNOSIS — Z1231 Encounter for screening mammogram for malignant neoplasm of breast: Secondary | ICD-10-CM | POA: Diagnosis not present

## 2018-04-13 ENCOUNTER — Encounter: Payer: Self-pay | Admitting: Family Medicine

## 2018-04-13 DIAGNOSIS — M549 Dorsalgia, unspecified: Secondary | ICD-10-CM | POA: Insufficient documentation

## 2018-04-13 NOTE — Assessment & Plan Note (Signed)
Needs supplementing , will rept in 6 months

## 2018-04-13 NOTE — Assessment & Plan Note (Signed)
present approximately 3 weeks prior to appt, musculoskeletal in origin, resolved by the time pt seen in the office

## 2018-04-13 NOTE — Assessment & Plan Note (Addendum)
Updated lab needed and  obtained, she needs to supplement her iron Still reports heavy bleeding for 2 to 3 days / cycle, no interest in definitive management, hoping to enter menopause soon

## 2018-09-09 DIAGNOSIS — H52223 Regular astigmatism, bilateral: Secondary | ICD-10-CM | POA: Diagnosis not present

## 2018-09-09 DIAGNOSIS — H10811 Pingueculitis, right eye: Secondary | ICD-10-CM | POA: Diagnosis not present

## 2018-09-09 DIAGNOSIS — H5213 Myopia, bilateral: Secondary | ICD-10-CM | POA: Diagnosis not present

## 2018-10-07 ENCOUNTER — Encounter: Payer: Self-pay | Admitting: Family Medicine

## 2018-10-07 ENCOUNTER — Ambulatory Visit: Payer: 59 | Admitting: Family Medicine

## 2018-10-07 VITALS — BP 108/70 | HR 76 | Resp 14 | Ht 65.0 in | Wt 144.0 lb

## 2018-10-07 DIAGNOSIS — E559 Vitamin D deficiency, unspecified: Secondary | ICD-10-CM

## 2018-10-07 DIAGNOSIS — N920 Excessive and frequent menstruation with regular cycle: Secondary | ICD-10-CM

## 2018-10-07 DIAGNOSIS — Z13220 Encounter for screening for lipoid disorders: Secondary | ICD-10-CM

## 2018-10-07 DIAGNOSIS — D500 Iron deficiency anemia secondary to blood loss (chronic): Secondary | ICD-10-CM

## 2018-10-07 DIAGNOSIS — E611 Iron deficiency: Secondary | ICD-10-CM

## 2018-10-07 DIAGNOSIS — Z2821 Immunization not carried out because of patient refusal: Secondary | ICD-10-CM | POA: Insufficient documentation

## 2018-10-07 DIAGNOSIS — D539 Nutritional anemia, unspecified: Secondary | ICD-10-CM | POA: Diagnosis not present

## 2018-10-07 DIAGNOSIS — Z723 Lack of physical exercise: Secondary | ICD-10-CM | POA: Insufficient documentation

## 2018-10-07 NOTE — Assessment & Plan Note (Signed)
Updated lab needed at/ before next visit.   

## 2018-10-07 NOTE — Assessment & Plan Note (Signed)
No regular exercise, discussed the importance of same and she is to change this behavior ( used to exercise in the past)

## 2018-10-07 NOTE — Assessment & Plan Note (Signed)
Discussed the importance of immunization ffor 5 minutes. Despite this pt refuses both the influenza and the shingles vaccine. Pt ed provided in writing for her review. She knows she can call in for vaccination if she changes her mind

## 2018-10-07 NOTE — Progress Notes (Signed)
   Dawn [name]     MRN: [id number]      DOB: 07-19-1966   HPI Dawn [name] is here for follow up and re-evaluation of chronic medical conditions, medication management and review of any available recent lab and radiology data.  Preventive health is updated, specifically  Cancer screening and Immunization.   Questions or concerns regarding consultations or procedures which the PT has had in the interim are  addressed. Takes iron but inconsistently, probably 4 days/ week Still has very heavy cycles, bleeds 5 days per week, flooding and clotting, no interest intervention currently ROS Denies recent fever or chills. Denies sinus pressure, nasal congestion, ear pain or sore throat. Denies chest congestion, productive cough or wheezing. Denies chest pains, palpitations and leg swelling Denies abdominal pain, nausea, vomiting,diarrhea or constipation.   Denies dysuria, frequency, hesitancy or incontinence. Denies joint pain, swelling and limitation in mobility. Denies headaches, seizures, numbness, or tingling. Denies depression, anxiety or insomnia. Denies skin break down or rash.   PE  BP 108/70   Pulse 76   Resp 14   Ht 5\' 5"  (1.651 m)   Wt 144 lb (65.3 kg)   SpO2 99%   BMI 23.96 kg/m   Patient alert and oriented and in no cardiopulmonary distress.  HEENT: No facial asymmetry, EOMI,   oropharynx pink and moist.  Neck supple no JVD, no mass.  Chest: Clear to auscultation bilaterally.  CVS: S1, S2 no murmurs, no S3.Regular rate.  ABD: Soft non tender.   Ext: No edema  MS: Adequate ROM spine, shoulders, hips and knees.  Skin: Intact, no ulcerations or rash noted.  Psych: Good eye contact, normal affect. Memory intact not anxious or depressed appearing.  CNS: CN 2-12 intact, power,  normal throughout.no focal deficits noted.   Assessment & Plan Iron deficiency anemia due to chronic blood loss Updated lab needed at/ before next visit.   Vitamin D  deficiency Updated lab needed not taking supplement  Menorrhagia with regular cycle No interest in intervention, managed by Gyne, has dx of fibroids  Iron deficiency Updated lab needed, takes iron inconsiustently   Lack of exercise No regular exercise, discussed the importance of same and she is to change this behavior ( used to exercise in the past)  Immunization refused Discussed the importance of immunization ffor 5 minutes. Despite this pt refuses both the influenza and the shingles vaccine. Pt ed provided in writing for her review. She knows she can call in for vaccination if she changes her mind

## 2018-10-07 NOTE — Patient Instructions (Signed)
F/U in 8.5 months, call if tyu need me sooner  Please commit to daily exercise for 30 ,mins  You are at an ideal body weight, great!  Please add breakfast to your meals   Labs today CBc, iron and ferritin, lipid, chem and EGFGr, TSH and vit D. We will call with result  Reconsider both the flu and shingles vaccines, we will give you pt ed on both   Pls set TV to turn off by 10 :30 for BEST  Sleep  Start calcium1000mg  to  1200 mg daily with Vit D3 1000 IU daily

## 2018-10-07 NOTE — Assessment & Plan Note (Signed)
Updated lab needed, takes iron inconsiustently

## 2018-10-07 NOTE — Assessment & Plan Note (Addendum)
Updated lab needed not taking supplement

## 2018-10-07 NOTE — Assessment & Plan Note (Addendum)
No interest in intervention, managed by Gyne, has dx of fibroids

## 2018-10-08 ENCOUNTER — Other Ambulatory Visit: Payer: Self-pay | Admitting: Family Medicine

## 2018-10-08 LAB — BASIC METABOLIC PANEL WITH GFR
BUN: 16 mg/dL (ref 7–25)
CHLORIDE: 105 mmol/L (ref 98–110)
CO2: 29 mmol/L (ref 20–32)
Calcium: 9.8 mg/dL (ref 8.6–10.4)
Creat: 0.69 mg/dL (ref 0.50–1.05)
GFR, Est African American: 117 mL/min/{1.73_m2} (ref >=60)
GFR, Est Non African American: 101 mL/min/{1.73_m2} (ref >=60)
GLUCOSE: 84 mg/dL (ref 65–99)
POTASSIUM: 4 mmol/L (ref 3.5–5.3)
SODIUM: 142 mmol/L (ref 135–146)

## 2018-10-08 LAB — CBC
HCT: 39.5 % (ref 35.0–45.0)
Hemoglobin: 12.2 g/dL (ref 11.7–15.5)
MCH: 22.2 pg — AB (ref 27.0–33.0)
MCHC: 30.9 g/dL — ABNORMAL LOW (ref 32.0–36.0)
MCV: 71.8 fL — ABNORMAL LOW (ref 80.0–100.0)
MPV: 10.9 fL (ref 7.5–12.5)
PLATELETS: 250 10*3/uL (ref 140–400)
RBC: 5.5 10*6/uL — AB (ref 3.80–5.10)
RDW: 16 % — ABNORMAL HIGH (ref 11.0–15.0)
WBC: 2.9 10*3/uL — ABNORMAL LOW (ref 3.8–10.8)

## 2018-10-08 LAB — IRON: Iron: 114 ug/dL (ref 45–160)

## 2018-10-08 LAB — VITAMIN D 25 HYDROXY (VIT D DEFICIENCY, FRACTURES): VIT D 25 HYDROXY: 6 ng/mL — AB (ref 30–100)

## 2018-10-08 LAB — LIPID PANEL
CHOL/HDL RATIO: 2.5 (calc) (ref <5.0)
CHOLESTEROL: 213 mg/dL — AB (ref <200)
HDL: 85 mg/dL (ref >=50)
LDL CHOLESTEROL (CALC): 114 mg/dL — AB
Non-HDL Cholesterol (Calc): 128 mg/dL (calc) (ref <130)
Triglycerides: 57 mg/dL (ref <150)

## 2018-10-08 LAB — FERRITIN: Ferritin: 17 ng/mL (ref 16–232)

## 2018-10-08 LAB — TSH: TSH: 0.81 m[IU]/L

## 2018-10-08 MED ORDER — ERGOCALCIFEROL 1.25 MG (50000 UT) PO CAPS: 50000.0000 [IU] | ORAL_CAPSULE | ORAL | 3 refills | Status: DC

## 2018-10-08 NOTE — Progress Notes (Signed)
Vitamin d

## 2018-10-14 ENCOUNTER — Telehealth: Payer: Self-pay

## 2018-10-14 DIAGNOSIS — D72819 Decreased white blood cell count, unspecified: Secondary | ICD-10-CM

## 2018-10-14 NOTE — Telephone Encounter (Signed)
Referral to hematology entered per MD

## 2018-10-22 ENCOUNTER — Encounter (HOSPITAL_COMMUNITY): Payer: Self-pay | Admitting: Internal Medicine

## 2018-10-22 ENCOUNTER — Inpatient Hospital Stay (HOSPITAL_COMMUNITY): Payer: 59 | Attending: Internal Medicine | Admitting: Internal Medicine

## 2018-10-22 ENCOUNTER — Other Ambulatory Visit: Payer: Self-pay

## 2018-10-22 VITALS — BP 111/48 | HR 79 | Temp 98.0°F | Resp 16 | Ht 65.0 in | Wt 144.3 lb

## 2018-10-22 DIAGNOSIS — D72818 Other decreased white blood cell count: Secondary | ICD-10-CM | POA: Diagnosis not present

## 2018-10-22 DIAGNOSIS — D500 Iron deficiency anemia secondary to blood loss (chronic): Secondary | ICD-10-CM | POA: Diagnosis not present

## 2018-10-22 DIAGNOSIS — K5900 Constipation, unspecified: Secondary | ICD-10-CM | POA: Diagnosis not present

## 2018-10-22 NOTE — Progress Notes (Signed)
Referring Physician:  Dr. Moshe Cipro  Diagnosis Iron deficiency anemia due to chronic blood loss - Plan: CBC with Differential/Platelet, Comprehensive metabolic panel, Ferritin, Lactate dehydrogenase, Protein electrophoresis, serum, Hemoglobinopathy evaluation, Vitamin B12, Folate, Methylmalonic acid, serum, Haptoglobin, Hepatitis B core antibody, total, Hepatitis B surface antibody,qualitative, Hepatitis B surface antigen, Hepatitis C RNA quantitative, Hepatitis panel, acute, HIV Antibody (routine testing w rflx), ANA, IFA (with reflex)  Other decreased white blood cell (WBC) count - Plan: CBC with Differential/Platelet, Comprehensive metabolic panel, Ferritin, Lactate dehydrogenase, Protein electrophoresis, serum, Hemoglobinopathy evaluation, Vitamin B12, Folate, Methylmalonic acid, serum, Haptoglobin, Hepatitis B core antibody, total, Hepatitis B surface antibody,qualitative, Hepatitis B surface antigen, Hepatitis C RNA quantitative, Hepatitis panel, acute, HIV Antibody (routine testing w rflx), ANA, IFA (with reflex)  Staging Cancer Staging No matching staging information was found for the patient.  Assessment and Plan:  1.  Iron deficiency anemia.  53 year old female referred for evaluation due to leukopenia and anemia.  She reports history of menorrhagia.  She has been seen by GYN and GI in the past.  Pt is currently on oral iron and is tolerating therapy.  Pt has EGD and colonoscopy done 09/27/2017 by Dr. Laural Golden and showed normal esophagus, external hemorrhoids.  She denies any blood in stool or urine.  Labs done 10/07/2018 showed WBC 2.9 HB 12.2 MCV 72 plts 250,000.  Chemistries WNL with K+ 4 Cr 0.69.  Ferritin 17, Vitamin D 6.  Pt is seen today for consultation due to Iron deficiency anemia.    Labs done 10/07/2018 consistent with IDA with HB adequate at 12.2 and ferritin of 17.  Pt currently on oral iron and is tolerating therapy.  She has been seen by GI with EGD and colonoscopy done in 08/2017.  Pt  was given option of IV iron but desires to continue oral iron.  Likely etiology due to menorrhagia.  Pt advised to follow-up with GYN for recommendations.  She will RTC in 11/2018 with labs evaluation with CBC with Differential/Platelet, Comprehensive metabolic panel, Ferritin, Lactate dehydrogenase, Protein electrophoresis, serum, Hemoglobinopathy evaluation, Vitamin B12, Folate, Methylmalonic acid, serum, Haptoglobin.  2.  Leukopenia.  Likely a normal variant.  WBC stable at 2.9.  She has a normal differential.  On RTC will check Hepatitis B core antibody, total, Hepatitis B surface antibody,qualitative, Hepatitis B surface antigen, Hepatitis C RNA quantitative, Hepatitis panel, acute, HIV Antibody (routine testing w rflx), ANA, IFA (with reflex).  3.  Menorrhagia.  Likely etiology of IDA.  Pt should follow-up with GYN if ongoing heavy cycles due to risk of ongoing IDA if cycles remain heavy.    4.  Constipation.  Stool softeners prn.  Pt had colonoscopy done 08/2017 that showed external hemorrhoids.  Follow-up with GI if ongoing issues.    5.  Health maintenance.  Continue mammogram screening and GI follow-up as recommended.    40 minutes spent with more than 50% spent in review of records, counseling and coordination of care.    HPI:  53 year old female referred for evaluation due to leukopenia and anemia.  She reports history of menorrhagia.  She has been seen by GYN and GI in the past.  Pt is currently on oral iron and is tolerating therapy.  Pt has EGD and colonoscopy done 09/27/2017 by Dr. Laural Golden and showed normal esophagus, external hemorrhoids.  She denies any blood in stool or urine.  Labs done 10/07/2018 showed WBC 2.9 HB 12.2 MCV 72 plts 250,000.  Chemistries WNL with K+ 4  Cr 0.69.  Ferritin 17, Vitamin D 6.  Pt is seen today for consultation due to Iron deficiency anemia.    Problem List Patient Active Problem List   Diagnosis Date Noted  . Lack of exercise [Z72.3] 10/07/2018  .  Immunization refused [Z28.21] 10/07/2018  . Upper back pain on right side [M54.9] 04/13/2018  . Anxiety [F41.9] 05/21/2017  . At risk for infectious disease due to recent foreign travel [Z91.89] 05/21/2017  . Exposure to parasitic disease [Z20.7] 04/30/2017  . Absolute anemia [D64.9] 03/28/2017  . Positive Lyme disease serology [R76.8] 03/22/2017  . [facility] spotted fever) [A77.0] 03/22/2017  . Fibroid [D21.9] 02/09/2017  . Enlarged uterus [N85.2] 01/30/2017  . Iron deficiency anemia due to chronic blood loss [D50.0] 01/30/2017  . Menorrhagia with regular cycle [N92.0] 01/30/2017  . Nutritional anemia [D53.9] 01/19/2017  . Iron deficiency [E61.1] 01/19/2017  . Vitamin D deficiency [E55.9] 12/28/2008    Past Medical History Past Medical History:  Diagnosis Date  . Anemia   . Anxiety 05/21/2017  . At risk for infectious disease due to recent foreign travel 07/28/2018   pt traveled to [place]  . Burning with urination 08/17/2014  . BV (bacterial vaginosis) 01/21/2014  . Fibroid 02/09/2017  . Lyme disease   . [facility] spotted fever   . Vaginal discharge 01/21/2014  . Vaginal itching 11/03/2015    Past Surgical History Past Surgical History:  Procedure Laterality Date  . CESAREAN SECTION    . COLONOSCOPY N/A 09/27/2017   Procedure: COLONOSCOPY;  Surgeon: Rogene Houston, MD;  Location: [facility];  Service: Endoscopy;  Laterality: N/A;  . ESOPHAGOGASTRODUODENOSCOPY N/A 09/27/2017   Procedure: ESOPHAGOGASTRODUODENOSCOPY (EGD);  Surgeon: Rogene Houston, MD;  Location: [facility];  Service: Endoscopy;  Laterality: N/A;  100-rescheduled to 09/27/17 @ 8:30am per Lelon Frohlich   . TUBAL LIGATION      Family History Family History  Problem Relation Age of Onset  . Hypertension Mother   . Hypertension Father   . Cancer Father   . Alzheimer's disease Maternal Grandmother   . Cancer Maternal Grandfather        colon  . Colon cancer Maternal Grandfather   . Cancer  Paternal Grandfather        colon  . Colon cancer Paternal Grandfather      Social History  reports that she has never smoked. She has never used smokeless tobacco. She reports that she does not drink alcohol or use drugs.  Medications  Current Outpatient Medications:  .  ergocalciferol (VITAMIN D2) 1.25 MG (50000 UT) capsule, Take 1 capsule (50,000 Units total) by mouth once a week. One capsule once weekly, Disp: 12 capsule, Rfl: 3 .  ferrous sulfate 325 (65 FE) MG tablet, Take 325 mg by mouth daily with breakfast. , Disp: , Rfl:   Allergies Patient has no known allergies.  Review of Systems Review of Systems - Oncology ROS negative other than menorrhagia and constipation.     Physical Exam  Vitals Wt Readings from Last 3 Encounters:  10/22/18 144 lb 4.8 oz (65.5 kg)  10/07/18 144 lb (65.3 kg)  03/26/18 154 lb 1.9 oz (69.9 kg)   Temp Readings from Last 3 Encounters:  10/22/18 98 F (36.7 C) (Oral)  09/27/17 98.8 F (37.1 C) (Oral)  05/21/17 98 F (36.7 C) (Oral)   BP Readings from Last 3 Encounters:  10/22/18 (!) 111/48  10/07/18 108/70  03/26/18 (!) 114/58   Pulse Readings from Last  3 Encounters:  10/22/18 79  10/07/18 76  03/26/18 74   Constitutional: Well-developed, well-nourished, and in no distress.   HENT: Head: Normocephalic and atraumatic.  Mouth/Throat: No oropharyngeal exudate. Mucosa moist. Eyes: Pupils are equal, round, and reactive to light. Conjunctivae are normal. No scleral icterus.  Neck: Normal range of motion. Neck supple. No JVD present.  Cardiovascular: Normal rate, regular rhythm and normal heart sounds.  Exam reveals no gallop and no friction rub.   No murmur heard. Pulmonary/Chest: Effort normal and breath sounds normal. No respiratory distress. No wheezes.No rales.  Abdominal: Soft. Bowel sounds are normal. No distension. There is no tenderness. There is no guarding.  Musculoskeletal: No edema or tenderness.  Lymphadenopathy: No  cervical,axillary or supraclavicular adenopathy.  Neurological: Alert and oriented to person, place, and time. No cranial nerve deficit.  Skin: Skin is warm and dry. No rash noted. No erythema. No pallor.  Psychiatric: Affect and judgment normal.   Labs No visits with results within 3 Day(s) from this visit.  Latest known visit with results is:  Office Visit on 10/07/2018  Component Date Value Ref Range Status  . WBC 10/07/2018 2.9* 3.8 - 10.8 Thousand/uL Final  . RBC 10/07/2018 5.50* 3.80 - 5.10 Million/uL Final  . Hemoglobin 10/07/2018 12.2  11.7 - 15.5 g/dL Final  . HCT 10/07/2018 39.5  35.0 - 45.0 % Final  . MCV 10/07/2018 71.8* 80.0 - 100.0 fL Final  . MCH 10/07/2018 22.2* 27.0 - 33.0 pg Final  . MCHC 10/07/2018 30.9* 32.0 - 36.0 g/dL Final  . RDW 10/07/2018 16.0* 11.0 - 15.0 % Final  . Platelets 10/07/2018 250  140 - 400 Thousand/uL Final  . MPV 10/07/2018 10.9  7.5 - 12.5 fL Final  . Iron 10/07/2018 114  45 - 160 mcg/dL Final  . Ferritin 10/07/2018 17  16 - 232 ng/mL Final  . Cholesterol 10/07/2018 213* <200 mg/dL Final  . HDL 10/07/2018 85  > OR = 50 mg/dL Final  . Triglycerides 10/07/2018 57  <150 mg/dL Final  . LDL Cholesterol (Calc) 10/07/2018 114* mg/dL (calc) Final   Comment: Reference range: <100 . Desirable range <100 mg/dL for primary prevention;   <70 mg/dL for patients with CHD or diabetic patients  with > or = 2 CHD risk factors. Marland Kitchen LDL-C is now calculated using the Martin-Hopkins  calculation, which is a validated novel method providing  better accuracy than the Friedewald equation in the  estimation of LDL-C.  Cresenciano Genre et al. Annamaria Helling. 0254;270(62): 2061-2068  (http://education.[facility].com/faq/FAQ164)   . Total CHOL/HDL Ratio 10/07/2018 2.5  <5.0 (calc) Final  . Non-HDL Cholesterol (Calc) 10/07/2018 128  <130 mg/dL (calc) Final   Comment: For patients with diabetes plus 1 major ASCVD risk  factor, treating to a non-HDL-C goal of <100 mg/dL  (LDL-C  of <70 mg/dL) is considered a therapeutic  option.   . Glucose, Bld 10/07/2018 84  65 - 99 mg/dL Final   Comment: .            Fasting reference interval .   . BUN 10/07/2018 16  7 - 25 mg/dL Final  . Creat 10/07/2018 0.69  0.50 - 1.05 mg/dL Final   Comment: For patients >27 years of age, the reference limit for Creatinine is approximately 13% higher for people identified as African-American. .   . GFR, Est Non African American 10/07/2018 101  > OR = 60 mL/min/1.56m2 Final  . GFR, Est African American 10/07/2018 117  > OR = 60  mL/min/1.32m2 Final  . BUN/Creatinine Ratio 00/17/4944 NOT APPLICABLE  6 - 22 (calc) Final  . Sodium 10/07/2018 142  135 - 146 mmol/L Final  . Potassium 10/07/2018 4.0  3.5 - 5.3 mmol/L Final  . Chloride 10/07/2018 105  98 - 110 mmol/L Final  . CO2 10/07/2018 29  20 - 32 mmol/L Final  . Calcium 10/07/2018 9.8  8.6 - 10.4 mg/dL Final  . TSH 10/07/2018 0.81  mIU/L Final   Comment:           Reference Range .           > or = 20 Years  0.40-4.50 .                Pregnancy Ranges           First trimester    0.26-2.66           Second trimester   0.55-2.73           Third trimester    0.43-2.91   . Vit D, 25-Hydroxy 10/07/2018 6* 30 - 100 ng/mL Final   Comment: Vitamin D Status         25-OH Vitamin D: . Deficiency:                    <20 ng/mL Insufficiency:             20 - 29 ng/mL Optimal:                 > or = 30 ng/mL . For 25-OH Vitamin D testing on patients on  D2-supplementation and patients for whom quantitation  of D2 and D3 fractions is required, the [facility]ureD(TM) 25-OH VIT D, (D2,D3), LC/MS/MS is recommended: order  code [phone] (patients >35yrs). . For more information on this test, go to: http://education.[facility].com/faq/FAQ163 (This link is being provided for  informational/educational purposes only.)      Pathology Orders Placed This Encounter  Procedures  . CBC with Differential/Platelet    Standing Status:    Future    Standing Expiration Date:   10/23/2019  . Comprehensive metabolic panel    Standing Status:   Future    Standing Expiration Date:   10/23/2019  . Ferritin    Standing Status:   Future    Standing Expiration Date:   10/23/2019  . Lactate dehydrogenase    Standing Status:   Future    Standing Expiration Date:   10/23/2019  . Protein electrophoresis, serum    Standing Status:   Future    Standing Expiration Date:   10/23/2019  . Hemoglobinopathy evaluation    Standing Status:   Future    Standing Expiration Date:   10/23/2019  . Vitamin B12    Standing Status:   Future    Standing Expiration Date:   10/23/2019  . Folate    Standing Status:   Future    Standing Expiration Date:   10/23/2019  . Methylmalonic acid, serum    Standing Status:   Future    Standing Expiration Date:   10/23/2019  . Haptoglobin    Standing Status:   Future    Standing Expiration Date:   10/23/2019  . Hepatitis B core antibody, total    Standing Status:   Future    Standing Expiration Date:   10/23/2019  . Hepatitis B surface antibody,qualitative    Standing Status:   Future    Standing Expiration Date:   10/23/2019  .  Hepatitis B surface antigen    Standing Status:   Future    Standing Expiration Date:   10/23/2019  . Hepatitis C RNA quantitative    Standing Status:   Future    Standing Expiration Date:   10/23/2019  . Hepatitis panel, acute    Standing Status:   Future    Standing Expiration Date:   10/23/2019  . HIV Antibody (routine testing w rflx)    Standing Status:   Future    Standing Expiration Date:   10/23/2019  . ANA, IFA (with reflex)    Standing Status:   Future    Standing Expiration Date:   10/23/2019       Zoila Shutter MD

## 2018-10-22 NOTE — Patient Instructions (Signed)
[facility]  Discharge Instructions: You saw Dr. Higgs today                               _______________________________________________________________  Thank you for choosing [facility] to provide your oncology and hematology care.  To afford each patient quality time with our providers, please arrive at least 15 minutes before your scheduled appointment.  You need to re-schedule your appointment if you arrive 10 or more minutes late.  We strive to give you quality time with our providers, and arriving late affects you and other patients whose appointments are after yours.  Also, if you no show three or more times for appointments you may be dismissed from the clinic.  Again, thank you for choosing [facility]. Our hope is that these requests will allow you access to exceptional care and in a timely manner. _______________________________________________________________  If you have questions after your visit, please contact our office at [phone] between the hours of 8:30 a.m. and 5:00 p.m. Voicemails left after 4:30 p.m. will not be returned until the following business day. _______________________________________________________________  For prescription refill requests, have your pharmacy contact our office. _______________________________________________________________  Recommendations made by the consultant and any test results will be sent to your referring physician. _______________________________________________________________ 

## 2018-12-12 ENCOUNTER — Other Ambulatory Visit (HOSPITAL_COMMUNITY): Payer: Self-pay | Admitting: *Deleted

## 2018-12-12 DIAGNOSIS — D500 Iron deficiency anemia secondary to blood loss (chronic): Secondary | ICD-10-CM

## 2018-12-13 ENCOUNTER — Other Ambulatory Visit (HOSPITAL_COMMUNITY): Payer: 59

## 2018-12-16 ENCOUNTER — Other Ambulatory Visit: Payer: Self-pay

## 2018-12-16 ENCOUNTER — Inpatient Hospital Stay (HOSPITAL_COMMUNITY): Payer: 59 | Attending: Nurse Practitioner

## 2018-12-16 DIAGNOSIS — D72819 Decreased white blood cell count, unspecified: Secondary | ICD-10-CM | POA: Insufficient documentation

## 2018-12-16 DIAGNOSIS — Z79899 Other long term (current) drug therapy: Secondary | ICD-10-CM | POA: Insufficient documentation

## 2018-12-16 DIAGNOSIS — K5900 Constipation, unspecified: Secondary | ICD-10-CM | POA: Insufficient documentation

## 2018-12-16 DIAGNOSIS — N920 Excessive and frequent menstruation with regular cycle: Secondary | ICD-10-CM | POA: Insufficient documentation

## 2018-12-16 DIAGNOSIS — D500 Iron deficiency anemia secondary to blood loss (chronic): Secondary | ICD-10-CM | POA: Insufficient documentation

## 2018-12-16 DIAGNOSIS — Z800 Family history of malignant neoplasm of digestive organs: Secondary | ICD-10-CM | POA: Diagnosis not present

## 2018-12-16 DIAGNOSIS — F419 Anxiety disorder, unspecified: Secondary | ICD-10-CM | POA: Insufficient documentation

## 2018-12-16 LAB — COMPREHENSIVE METABOLIC PANEL
ALT: 12 U/L (ref 0–44)
AST: 15 U/L (ref 15–41)
Albumin: 3.8 g/dL (ref 3.5–5.0)
Alkaline Phosphatase: 54 U/L (ref 38–126)
Anion gap: 10 (ref 5–15)
BUN: 12 mg/dL (ref 6–20)
CO2: 25 mmol/L (ref 22–32)
Calcium: 8.8 mg/dL — ABNORMAL LOW (ref 8.9–10.3)
Chloride: 103 mmol/L (ref 98–111)
Creatinine, Ser: 0.56 mg/dL (ref 0.44–1.00)
GFR calc Af Amer: >60 mL/min (ref >60)
GFR calc non Af Amer: >60 mL/min (ref >60)
Glucose, Bld: 93 mg/dL (ref 70–99)
Potassium: 3.4 mmol/L — ABNORMAL LOW (ref 3.5–5.1)
Sodium: 138 mmol/L (ref 135–145)
Total Bilirubin: 0.9 mg/dL (ref 0.3–1.2)
Total Protein: 7.5 g/dL (ref 6.5–8.1)

## 2018-12-16 LAB — CBC WITH DIFFERENTIAL/PLATELET
Abs Immature Granulocytes: 0.04 10*3/uL (ref 0.00–0.07)
Basophils Absolute: 0.1 10*3/uL (ref 0.0–0.1)
Basophils Relative: 1 %
Eosinophils Absolute: 0.1 10*3/uL (ref 0.0–0.5)
Eosinophils Relative: 1 %
HCT: 38.5 % (ref 36.0–46.0)
Hemoglobin: 11.5 g/dL — ABNORMAL LOW (ref 12.0–15.0)
Immature Granulocytes: 1 %
Lymphocytes Relative: 24 %
Lymphs Abs: 1.3 10*3/uL (ref 0.7–4.0)
MCH: 21.9 pg — ABNORMAL LOW (ref 26.0–34.0)
MCHC: 29.9 g/dL — ABNORMAL LOW (ref 30.0–36.0)
MCV: 73.3 fL — ABNORMAL LOW (ref 80.0–100.0)
Monocytes Absolute: 0.5 10*3/uL (ref 0.1–1.0)
Monocytes Relative: 9 %
Neutro Abs: 3.4 10*3/uL (ref 1.7–7.7)
Neutrophils Relative %: 64 %
Platelets: 245 10*3/uL (ref 150–400)
RBC: 5.25 MIL/uL — ABNORMAL HIGH (ref 3.87–5.11)
RDW: 17.3 % — ABNORMAL HIGH (ref 11.5–15.5)
WBC: 5.3 10*3/uL (ref 4.0–10.5)
nRBC: 0 % (ref 0.0–0.2)

## 2018-12-16 LAB — FOLATE: Folate: 7.5 ng/mL (ref >5.9)

## 2018-12-16 LAB — VITAMIN B12: Vitamin B-12: 290 pg/mL (ref 180–914)

## 2018-12-16 LAB — LACTATE DEHYDROGENASE: LDH: 98 U/L (ref 98–192)

## 2018-12-16 LAB — FERRITIN: Ferritin: 13 ng/mL (ref 11–307)

## 2018-12-17 LAB — HEPATITIS B SURFACE ANTIBODY,QUALITATIVE: Hep B S Ab: NONREACTIVE

## 2018-12-17 LAB — HEPATITIS PANEL, ACUTE
HCV Ab: <0.1 s/co ratio (ref 0.0–0.9)
Hep A IgM: NEGATIVE
Hep B C IgM: NEGATIVE
Hepatitis B Surface Ag: NEGATIVE

## 2018-12-17 LAB — PROTEIN ELECTROPHORESIS, SERUM
A/G Ratio: 1 (ref 0.7–1.7)
Albumin ELP: 3.6 g/dL (ref 2.9–4.4)
Alpha-1-Globulin: 0.2 g/dL (ref 0.0–0.4)
Alpha-2-Globulin: 0.7 g/dL (ref 0.4–1.0)
Beta Globulin: 1.1 g/dL (ref 0.7–1.3)
Gamma Globulin: 1.6 g/dL (ref 0.4–1.8)
Globulin, Total: 3.5 g/dL (ref 2.2–3.9)
Total Protein ELP: 7.1 g/dL (ref 6.0–8.5)

## 2018-12-17 LAB — ANTINUCLEAR ANTIBODIES, IFA: ANA Ab, IFA: NEGATIVE

## 2018-12-17 LAB — HEPATITIS B SURFACE ANTIGEN: Hepatitis B Surface Ag: NEGATIVE

## 2018-12-17 LAB — HAPTOGLOBIN: Haptoglobin: 115 mg/dL (ref 33–346)

## 2018-12-17 LAB — HIV ANTIBODY (ROUTINE TESTING W REFLEX): HIV Screen 4th Generation wRfx: NONREACTIVE

## 2018-12-18 LAB — HEMOGLOBINOPATHY EVALUATION
Hgb A2 Quant: 4.8 % — ABNORMAL HIGH (ref 1.8–3.2)
Hgb A: 93.4 % — ABNORMAL LOW (ref 96.4–98.8)
Hgb C: 0 %
Hgb F Quant: 1.8 % (ref 0.0–2.0)
Hgb S Quant: 0 %
Hgb Variant: 0 %

## 2018-12-19 LAB — METHYLMALONIC ACID, SERUM: Methylmalonic Acid, Quantitative: 74 nmol/L (ref 0–378)

## 2018-12-20 ENCOUNTER — Ambulatory Visit (HOSPITAL_COMMUNITY): Payer: 59 | Admitting: Hematology

## 2018-12-23 ENCOUNTER — Encounter (HOSPITAL_COMMUNITY): Payer: Self-pay | Admitting: Nurse Practitioner

## 2018-12-23 ENCOUNTER — Other Ambulatory Visit: Payer: Self-pay

## 2018-12-23 ENCOUNTER — Inpatient Hospital Stay (HOSPITAL_COMMUNITY): Payer: 59 | Admitting: Nurse Practitioner

## 2018-12-23 DIAGNOSIS — D72819 Decreased white blood cell count, unspecified: Secondary | ICD-10-CM

## 2018-12-23 DIAGNOSIS — N920 Excessive and frequent menstruation with regular cycle: Secondary | ICD-10-CM

## 2018-12-23 DIAGNOSIS — Z79899 Other long term (current) drug therapy: Secondary | ICD-10-CM

## 2018-12-23 DIAGNOSIS — D500 Iron deficiency anemia secondary to blood loss (chronic): Secondary | ICD-10-CM | POA: Diagnosis not present

## 2018-12-23 DIAGNOSIS — F419 Anxiety disorder, unspecified: Secondary | ICD-10-CM

## 2018-12-23 DIAGNOSIS — Z800 Family history of malignant neoplasm of digestive organs: Secondary | ICD-10-CM

## 2018-12-23 DIAGNOSIS — K5900 Constipation, unspecified: Secondary | ICD-10-CM

## 2018-12-23 NOTE — Progress Notes (Signed)
[place], [place] [address]   CLINIC:  Medical Oncology/Hematology  PCP:  Fayrene Helper, MD [address] [place] [address] [phone]   REASON FOR VISIT: Follow-up for iron deficiency anemia and leukopenia  CURRENT THERAPY: Oral iron supplementation and observation   INTERVAL HISTORY:  Ms. Horiuchi 53 y.o. female returns for routine follow-up for iron deficiency anemia and leukopenia.  She is been doing well since her last visit.  She has no complaints at this time.  She reports she is still having heavy menstrual cycles.  She denies any recent infections. Denies any nausea, vomiting, or diarrhea. Denies any new pains. Had not noticed any recent bleeding such as epistaxis, hematuria or hematochezia. Denies recent chest pain on exertion, shortness of breath on minimal exertion, pre-syncopal episodes, or palpitations. Denies any numbness or tingling in hands or feet. Denies any recent fevers, infections, or recent hospitalizations. Patient reports appetite at 100% and energy level at 100%.  She is eating well and maintaining her weight at this time.   REVIEW OF SYSTEMS:  Review of Systems  All other systems reviewed and are negative.    PAST MEDICAL/SURGICAL HISTORY:  Past Medical History:  Diagnosis Date  . Anemia   . Anxiety 05/21/2017  . At risk for infectious disease due to recent foreign travel 07/28/2018   pt traveled to [place]  . Burning with urination 08/17/2014  . BV (bacterial vaginosis) 01/21/2014  . Fibroid 02/09/2017  . Lyme disease   . [facility] spotted fever   . Vaginal discharge 01/21/2014  . Vaginal itching 11/03/2015   Past Surgical History:  Procedure Laterality Date  . CESAREAN SECTION    . COLONOSCOPY N/A 09/27/2017   Procedure: COLONOSCOPY;  Surgeon: Rogene Houston, MD;  Location: [facility];  Service: Endoscopy;  Laterality: N/A;  . ESOPHAGOGASTRODUODENOSCOPY N/A 09/27/2017   Procedure: ESOPHAGOGASTRODUODENOSCOPY (EGD);  Surgeon: Rogene Houston, MD;  Location: [facility];  Service: Endoscopy;  Laterality: N/A;  100-rescheduled to 09/27/17 @ 8:30am per Lelon Frohlich   . TUBAL LIGATION       SOCIAL HISTORY:  Social History   Socioeconomic History  . Marital status: Widowed    Spouse name: Not on file  . Number of children: 2  . Years of education: Not on file  . Highest education level: Not on file  Occupational History  . 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 Financial resource strain: Not hard at all  . Food insecurity:    Worry: Never true    Inability: Never true  . Transportation needs:    Medical: No    Non-medical: No  Tobacco Use  . Smoking status: Never Smoker  . Smokeless tobacco: Never Used  Substance and Sexual Activity  . Alcohol use: No  . Drug use: No  . Sexual activity: Not Currently    Birth control/protection: Surgical    Comment: tubal  Lifestyle  . Physical activity:    Days per week: 2 days    Minutes per session: 30 min  . Stress: Not at all  Relationships  . Social connections:    Talks on phone: Three times a week    Gets together: Three times a week    Attends religious service: More than 4 times per year    Active member of club or organization: Yes    Attends meetings of clubs or organizations: 1 to  4 times per year    Relationship status: Widowed  . Intimate partner violence:    Fear of current or ex partner: No    Emotionally abused: No    Physically abused: No    Forced sexual activity: No  Other Topics Concern  . Not on file  Social History Narrative  . Not on file    FAMILY HISTORY:  Family History  Problem Relation Age of Onset  . Hypertension Mother   . Hypertension Father   . Cancer Father   . Alzheimer's disease Maternal Grandmother   . Cancer Maternal Grandfather        colon  . Colon cancer Maternal Grandfather   . Cancer Paternal Grandfather        colon   . Colon cancer Paternal Grandfather     CURRENT MEDICATIONS:  Outpatient Encounter Medications as of 12/23/2018  Medication Sig  . ergocalciferol (VITAMIN D2) 1.25 MG (50000 UT) capsule Take 1 capsule (50,000 Units total) by mouth once a week. One capsule once weekly  . ferrous sulfate 325 (65 FE) MG tablet Take 325 mg by mouth daily with breakfast.    No facility-administered encounter medications on file as of 12/23/2018.     ALLERGIES:  No Known Allergies   PHYSICAL EXAM:  ECOG Performance status: 1  Vitals:   12/23/18 1113  BP: 113/61  Pulse: 66  Resp: 18  Temp: (!) 97.4 F (36.3 C)  SpO2: 99%   Filed Weights   12/23/18 1113  Weight: 151 lb (68.5 kg)    Physical Exam Constitutional:      Appearance: Normal appearance. She is normal weight.  Cardiovascular:     Rate and Rhythm: Normal rate and regular rhythm.     Heart sounds: Normal heart sounds.  Pulmonary:     Effort: Pulmonary effort is normal.     Breath sounds: Normal breath sounds.  Abdominal:     General: Bowel sounds are normal.     Palpations: Abdomen is soft.  Musculoskeletal: Normal range of motion.  Skin:    General: Skin is warm and dry.  Neurological:     Mental Status: She is alert and oriented to person, place, and time. Mental status is at baseline.  Psychiatric:        Mood and Affect: Mood normal.        Behavior: Behavior normal.        Thought Content: Thought content normal.        Judgment: Judgment normal.      LABORATORY DATA:  I have reviewed the labs as listed.  CBC    Component Value Date/Time   WBC 5.3 12/16/2018 0928   RBC 5.25 (H) 12/16/2018 0928   HGB 11.5 (L) 12/16/2018 0928   HCT 38.5 12/16/2018 0928   PLT 245 12/16/2018 0928   MCV 73.3 (L) 12/16/2018 0928   MCH 21.9 (L) 12/16/2018 0928   MCHC 29.9 (L) 12/16/2018 0928   RDW 17.3 (H) 12/16/2018 0928   LYMPHSABS 1.3 12/16/2018 0928   MONOABS 0.5 12/16/2018 0928   EOSABS 0.1 12/16/2018 0928   BASOSABS 0.1  12/16/2018 0928   CMP Latest Ref Rng & Units 12/16/2018 10/07/2018 05/01/2017  Glucose 70 - 99 mg/dL 93 84 76  BUN 6 - 20 mg/dL 12 16 14   Creatinine 0.44 - 1.00 mg/dL 0.56 0.69 0.66  Sodium 135 - 145 mmol/L 138 142 138  Potassium 3.5 - 5.1 mmol/L 3.4(L) 4.0 4.2  Chloride 98 - 111  mmol/L 103 105 103  CO2 22 - 32 mmol/L 25 29 27   Calcium 8.9 - 10.3 mg/dL 8.8(L) 9.8 9.0  Total Protein 6.5 - 8.1 g/dL 7.5 - 7.2  Total Bilirubin 0.3 - 1.2 mg/dL 0.9 - 0.6  Alkaline Phos 38 - 126 U/L 54 - 54  AST 15 - 41 U/L 15 - 12  ALT 0 - 44 U/L 12 - 8      I personally performed a face-to-face visit.  All questions were answered to patient's stated satisfaction. Encouraged patient to call with any new concerns or questions before his next visit to the cancer center and we can certain see him sooner, if needed.     ASSESSMENT & PLAN:   Iron deficiency anemia due to chronic blood loss 1.  Iron deficiency anemia: - Likely due to menorrhagia. - She is currently taking oral iron supplements.  She reports it does cause mild constipation however she is managing this with over-the-counter stool softeners. -She has been seen by her GYN help control her heavy cycles. -She has been seen by GI.  Last EGD and colonoscopy was done on 09/27/2017 by Dr. Laural Golden and it showed normal esophagus, and external hemorrhoids. -Patient had an evaluation of her blood work done including ferritin, LDH, protein electrophoresis, hemoglobinopathy evaluation, vitamin B12, folate, methylmalonic acid, haptoglobin, SPEP. - She denies any blood in her stool or urine. - Labs on 12/16/2018 showed her hemoglobin at 11.5, platelets 245, and ferritin 13.  Creatinine is 0.56.  Vitamin B12 was 290 and methylmalonic acid was 74. -We have recommended her starting vitamin B12 oral supplements. - We will follow-up with her in 4 months to repeat labs.  2.  Leukopenia: - Likely a normal variant. - WBC initially was 2.9 with a normal differential. -An  evaluation of her blood work was done including hepatitis B core antibody, total, hepatitis B surface antibody, qualitative, hepatitis B surface antigen, hepatitis C RNA quantitative, hepatitis panel, acute, HIV antibody, ANA, IFA, and SPEP.  All were negative. - Labs on 12/16/2018 showed her WBC at 5.3. -She denies any recurrent infections. -We will continue to monitor.       Orders placed this encounter:  Orders Placed This Encounter  Procedures  . Lactate dehydrogenase  . CBC with Differential/Platelet  . Comprehensive metabolic panel  . Ferritin  . Iron and TIBC  . TSH  . Vitamin B12  . VITAMIN D 25 Hydroxy (Vit-D Deficiency, Fractures)  . Folate     Francene Finders, FNP-C [place] [phone]

## 2018-12-23 NOTE — Assessment & Plan Note (Addendum)
1.  Iron deficiency anemia: - Likely due to menorrhagia. - She is currently taking oral iron supplements.  She reports it does cause mild constipation however she is managing this with over-the-counter stool softeners. -She has been seen by her GYN help control her heavy cycles. -She has been seen by GI.  Last EGD and colonoscopy was done on 09/27/2017 by Dr. Laural Golden and it showed normal esophagus, and external hemorrhoids. -Patient had an evaluation of her blood work done including ferritin, LDH, protein electrophoresis, hemoglobinopathy evaluation, vitamin B12, folate, methylmalonic acid, haptoglobin, SPEP. - She denies any blood in her stool or urine. - Labs on 12/16/2018 showed her hemoglobin at 11.5, platelets 245, and ferritin 13.  Creatinine is 0.56.  Vitamin B12 was 290 and methylmalonic acid was 74. -We have recommended her starting vitamin B12 oral supplements. - We will follow-up with her in 4 months to repeat labs.  2.  Leukopenia: - Likely a normal variant. - WBC initially was 2.9 with a normal differential. -An evaluation of her blood work was done including hepatitis B core antibody, total, hepatitis B surface antibody, qualitative, hepatitis B surface antigen, hepatitis C RNA quantitative, hepatitis panel, acute, HIV antibody, ANA, IFA, and SPEP.  All were negative. - Labs on 12/16/2018 showed her WBC at 5.3. -She denies any recurrent infections. -We will continue to monitor.

## 2018-12-23 NOTE — Patient Instructions (Signed)
[facility] at [facility] Discharge Instructions  Follow-up in 4 months with repeat labs.   Thank you for choosing [facility] at [facility] to provide your oncology and hematology care.  To afford each patient quality time with our provider, please arrive at least 15 minutes before your scheduled appointment time.   If you have a lab appointment with the Cancer Center please come in thru the  Main Entrance and check in at the main information desk  You need to re-schedule your appointment should you arrive 10 or more minutes late.  We strive to give you quality time with our providers, and arriving late affects you and other patients whose appointments are after yours.  Also, if you no show three or more times for appointments you may be dismissed from the clinic at the providers discretion.     Again, thank you for choosing [facility].  Our hope is that these requests will decrease the amount of time that you wait before being seen by our physicians.       _____________________________________________________________  Should you have questions after your visit to [facility], please contact our office at [phone] between the hours of 8:00 a.m. and 4:30 p.m.  Voicemails left after 4:00 p.m. will not be returned until the following business day.  For prescription refill requests, have your pharmacy contact our office and allow 72 hours.    Cancer Center Support Programs:   > Cancer Support Group  2nd Tuesday of the month 1pm-2pm, Journey Room    

## 2019-03-20 ENCOUNTER — Other Ambulatory Visit (HOSPITAL_COMMUNITY)
Admission: RE | Admit: 2019-03-20 | Discharge: 2019-03-20 | Disposition: A | Discharge status: Home / Self Care | Payer: 59 | Source: Ambulatory Visit | Attending: Family Medicine | Admitting: Family Medicine

## 2019-03-20 ENCOUNTER — Ambulatory Visit: Payer: 59 | Admitting: Family Medicine

## 2019-03-20 ENCOUNTER — Other Ambulatory Visit: Payer: Self-pay

## 2019-03-20 ENCOUNTER — Encounter: Payer: Self-pay | Admitting: Family Medicine

## 2019-03-20 VITALS — BP 104/70 | HR 72 | Temp 97.6°F | Ht 65.0 in | Wt 149.0 lb

## 2019-03-20 DIAGNOSIS — N760 Acute vaginitis: Secondary | ICD-10-CM | POA: Insufficient documentation

## 2019-03-20 DIAGNOSIS — K649 Unspecified hemorrhoids: Secondary | ICD-10-CM

## 2019-03-20 DIAGNOSIS — Z1231 Encounter for screening mammogram for malignant neoplasm of breast: Secondary | ICD-10-CM

## 2019-03-20 DIAGNOSIS — B009 Herpesviral infection, unspecified: Secondary | ICD-10-CM | POA: Diagnosis not present

## 2019-03-20 DIAGNOSIS — Z1159 Encounter for screening for other viral diseases: Secondary | ICD-10-CM

## 2019-03-20 MED ORDER — DOCUSATE SODIUM 50 MG PO CAPS: 50.0000 mg | ORAL_CAPSULE | Freq: Two times a day (BID) | ORAL | 5 refills | Status: DC

## 2019-03-20 MED ORDER — HYDROCORT-PRAMOXINE (PERIANAL) 1-1 % EX FOAM: 1.0000 | Freq: Three times a day (TID) | CUTANEOUS | 3 refills | Status: AC

## 2019-03-20 MED ORDER — FLUCONAZOLE 150 MG PO TABS: ORAL_TABLET | ORAL | 0 refills | Status: DC

## 2019-03-20 NOTE — Progress Notes (Signed)
   Dawn [name]     MRN: [id number]      DOB: Jul 23, 1966   HPI Dawn [name] is here c/o blood in stool x 1 occasion, does have piles and had  been straining. C/o vaginal itch as though she has a yeast infection, does state d/c is slightly thicker and heavier than before Otherwise no concerns  ROS Denies recent fever or chills. Denies sinus pressure, nasal congestion, ear pain or sore throat. Denies chest congestion, productive cough or wheezing. Denies chest pains, palpitations and leg swelling Denies abdominal pain, nausea, vomiting,diarrhea or constipation.    Denies joint pain, swelling and limitation in mobility. Denies headaches, seizures, numbness, or tingling. Denies depression, anxiety or insomnia. Denies skin break down or rash.   PE  BP 104/70   Pulse 72   Temp 97.6 F (36.4 C) (Temporal)   Ht 5\' 5"  (1.651 m)   Wt 149 lb (67.6 kg)   SpO2 100%   BMI 24.79 kg/m   Patient alert and oriented and in no cardiopulmonary distress.  HEENT: No facial asymmetry, EOMI,   oropharynx pink and moist.  Neck supple no JVD, no mass.  Chest: Clear to auscultation bilaterally.  CVS: S1, S2 no murmurs, no S3.Regular rate.  ABD: Soft non tender.   Ext: No edema  MS: Adequate ROM spine, shoulders, hips and knees.  Skin: Intact, no ulcerations or rash noted.  Psych: Good eye contact, normal affect. Memory intact not anxious or depressed appearing.  CNS: CN 2-12 intact, power,  normal throughout.no focal deficits noted.   Assessment & Plan  Vaginitis C/o itch, stinging and burning, feels as though due to yeast, rx for fluconazole sent, needs HSV2 testing asap  Hemorrhoids Recent acute bleed with straining, educated re need  For daily stool softener, also proctofoam prescribed for 1 week

## 2019-03-20 NOTE — Patient Instructions (Signed)
Follow-up as before call if you need me sooner.  Specimens are sent from the office today to the test for vaginal infection causing itch.  Fluconazole tablets to have been prescribed for presumed yeast infection.  Rectal bleeding is most likely from your hemorrhoids.  It is important that you to use as one stool softener 2 times daily to reduce straining which aggravates rectal bleeding.  Proctofoam cream is prescribed for use for 1 week to reduce the swelling of the hemorrhoids which causes bleeding.  Please get HSV-2 blood test today.  It is important that you exercise regularly at least 30 minutes 5 times a week. If you develop chest pain, have severe difficulty breathing, or feel very tired, stop exercising immediately and seek medical attention   Thanks for choosing  [facility], we consider it a privelige to serve you.

## 2019-03-21 LAB — URINE CYTOLOGY ANCILLARY ONLY
Chlamydia: NEGATIVE
Neisseria Gonorrhea: NEGATIVE
Trichomonas: NEGATIVE

## 2019-03-21 LAB — HSV 2 ANTIBODY, IGG: HSV 2 Glycoprotein G Ab, IgG: 1.63 index — ABNORMAL HIGH

## 2019-03-24 ENCOUNTER — Encounter: Payer: Self-pay | Admitting: Family Medicine

## 2019-03-24 ENCOUNTER — Other Ambulatory Visit: Payer: Self-pay | Admitting: Family Medicine

## 2019-03-24 LAB — URINE CYTOLOGY ANCILLARY ONLY: Candida vaginitis: NEGATIVE

## 2019-03-24 MED ORDER — ACYCLOVIR 400 MG PO TABS: 400.0000 mg | ORAL_TABLET | Freq: Three times a day (TID) | ORAL | 0 refills | Status: DC

## 2019-03-24 NOTE — Progress Notes (Signed)
a 

## 2019-03-30 ENCOUNTER — Encounter: Payer: Self-pay | Admitting: Family Medicine

## 2019-03-30 DIAGNOSIS — B009 Herpesviral infection, unspecified: Secondary | ICD-10-CM | POA: Insufficient documentation

## 2019-03-30 NOTE — Assessment & Plan Note (Signed)
C/o itch, stinging and burning, feels as though due to yeast, rx for fluconazole sent, needs HSV2 testing asap

## 2019-03-30 NOTE — Assessment & Plan Note (Signed)
Recent acute bleed with straining, educated re need  For daily stool softener, also proctofoam prescribed for 1 week

## 2019-04-17 ENCOUNTER — Other Ambulatory Visit (HOSPITAL_COMMUNITY): Payer: 59

## 2019-04-24 ENCOUNTER — Inpatient Hospital Stay (HOSPITAL_COMMUNITY): Payer: 59 | Attending: Hematology

## 2019-04-24 ENCOUNTER — Ambulatory Visit (HOSPITAL_COMMUNITY): Payer: 59 | Admitting: Nurse Practitioner

## 2019-04-24 ENCOUNTER — Other Ambulatory Visit: Payer: Self-pay

## 2019-04-24 DIAGNOSIS — D500 Iron deficiency anemia secondary to blood loss (chronic): Secondary | ICD-10-CM | POA: Diagnosis not present

## 2019-04-24 LAB — COMPREHENSIVE METABOLIC PANEL
ALT: 15 U/L (ref 0–44)
AST: 19 U/L (ref 15–41)
Albumin: 4.1 g/dL (ref 3.5–5.0)
Alkaline Phosphatase: 59 U/L (ref 38–126)
Anion gap: 7 (ref 5–15)
BUN: 14 mg/dL (ref 6–20)
CO2: 28 mmol/L (ref 22–32)
Calcium: 9.2 mg/dL (ref 8.9–10.3)
Chloride: 104 mmol/L (ref 98–111)
Creatinine, Ser: 0.68 mg/dL (ref 0.44–1.00)
GFR calc Af Amer: >60 mL/min (ref >60)
GFR calc non Af Amer: >60 mL/min (ref >60)
Glucose, Bld: 91 mg/dL (ref 70–99)
Potassium: 4.3 mmol/L (ref 3.5–5.1)
Sodium: 139 mmol/L (ref 135–145)
Total Bilirubin: 1.4 mg/dL — ABNORMAL HIGH (ref 0.3–1.2)
Total Protein: 7.9 g/dL (ref 6.5–8.1)

## 2019-04-24 LAB — CBC WITH DIFFERENTIAL/PLATELET
Abs Immature Granulocytes: 0.02 10*3/uL (ref 0.00–0.07)
Basophils Absolute: 0.1 10*3/uL (ref 0.0–0.1)
Basophils Relative: 1 %
Eosinophils Absolute: 0.1 10*3/uL (ref 0.0–0.5)
Eosinophils Relative: 1 %
HCT: 40.3 % (ref 36.0–46.0)
Hemoglobin: 11.9 g/dL — ABNORMAL LOW (ref 12.0–15.0)
Immature Granulocytes: 0 %
Lymphocytes Relative: 24 %
Lymphs Abs: 1.3 10*3/uL (ref 0.7–4.0)
MCH: 21.3 pg — ABNORMAL LOW (ref 26.0–34.0)
MCHC: 29.5 g/dL — ABNORMAL LOW (ref 30.0–36.0)
MCV: 72.2 fL — ABNORMAL LOW (ref 80.0–100.0)
Monocytes Absolute: 0.5 10*3/uL (ref 0.1–1.0)
Monocytes Relative: 9 %
Neutro Abs: 3.3 10*3/uL (ref 1.7–7.7)
Neutrophils Relative %: 65 %
Platelets: 224 10*3/uL (ref 150–400)
RBC: 5.58 MIL/uL — ABNORMAL HIGH (ref 3.87–5.11)
RDW: 17 % — ABNORMAL HIGH (ref 11.5–15.5)
WBC: 5.2 10*3/uL (ref 4.0–10.5)
nRBC: 0 % (ref 0.0–0.2)

## 2019-04-24 LAB — TSH: TSH: 1.187 u[IU]/mL (ref 0.350–4.500)

## 2019-04-24 LAB — VITAMIN B12: Vitamin B-12: 467 pg/mL (ref 180–914)

## 2019-04-24 LAB — FERRITIN: Ferritin: 6 ng/mL — ABNORMAL LOW (ref 11–307)

## 2019-04-24 LAB — IRON AND TIBC
Iron: 41 ug/dL (ref 28–170)
Saturation Ratios: 8 % — ABNORMAL LOW (ref 10.4–31.8)
TIBC: 497 ug/dL — ABNORMAL HIGH (ref 250–450)
UIBC: 456 ug/dL

## 2019-04-24 LAB — LACTATE DEHYDROGENASE: LDH: 110 U/L (ref 98–192)

## 2019-04-24 LAB — FOLATE: Folate: 10.1 ng/mL (ref >5.9)

## 2019-04-25 LAB — VITAMIN D 25 HYDROXY (VIT D DEFICIENCY, FRACTURES): Vit D, 25-Hydroxy: 15.3 ng/mL — ABNORMAL LOW (ref 30.0–100.0)

## 2019-05-01 ENCOUNTER — Other Ambulatory Visit: Payer: Self-pay

## 2019-05-01 ENCOUNTER — Inpatient Hospital Stay (HOSPITAL_COMMUNITY): Payer: 59 | Attending: Nurse Practitioner | Admitting: Nurse Practitioner

## 2019-05-01 ENCOUNTER — Encounter (HOSPITAL_COMMUNITY): Payer: Self-pay | Admitting: Nurse Practitioner

## 2019-05-01 DIAGNOSIS — E538 Deficiency of other specified B group vitamins: Secondary | ICD-10-CM | POA: Diagnosis not present

## 2019-05-01 DIAGNOSIS — Z8249 Family history of ischemic heart disease and other diseases of the circulatory system: Secondary | ICD-10-CM

## 2019-05-01 DIAGNOSIS — N920 Excessive and frequent menstruation with regular cycle: Secondary | ICD-10-CM

## 2019-05-01 DIAGNOSIS — D72819 Decreased white blood cell count, unspecified: Secondary | ICD-10-CM | POA: Diagnosis not present

## 2019-05-01 DIAGNOSIS — D500 Iron deficiency anemia secondary to blood loss (chronic): Secondary | ICD-10-CM

## 2019-05-01 DIAGNOSIS — Z79899 Other long term (current) drug therapy: Secondary | ICD-10-CM

## 2019-05-01 NOTE — Patient Instructions (Signed)
[facility] at [facility]  Discharge Instructions:  You had a telephone visit with Francene Finders, NP, today. _______________________________________________________________  Thank you for choosing [place] at [facility] to provide your oncology and hematology care.  To afford each patient quality time with our providers, please arrive at least 15 minutes before your scheduled appointment.  You need to re-schedule your appointment if you arrive 10 or more minutes late.  We strive to give you quality time with our providers, and arriving late affects you and other patients whose appointments are after yours.  Also, if you no show three or more times for appointments you may be dismissed from the clinic.  Again, thank you for choosing [facility] at [facility] hope is that these requests will allow you access to exceptional care and in a timely manner. _______________________________________________________________  If you have questions after your visit, please contact our office at [phone] between the hours of 8:30 a.m. and 5:00 p.m. Voicemails left after 4:30 p.m. will not be returned until the following business day. _______________________________________________________________  For prescription refill requests, have your pharmacy contact our office. _______________________________________________________________  Recommendations made by the consultant and any test results will be sent to your referring physician. _______________________________________________________________

## 2019-05-01 NOTE — Assessment & Plan Note (Addendum)
1.  Iron deficiency anemia: - Likely due to menorrhagia. - She is currently taking oral iron supplements.  She reports it does cause mild constipation however she is managing this with over-the-counter stool softeners. -She has been seen by her GYN help control her heavy cycles. -She has been seen by GI.  Last EGD and colonoscopy was done on 09/27/2017 by Dr. Laural Golden and it showed normal esophagus, and external hemorrhoids. -Patient had an evaluation of her blood work done including ferritin, LDH, protein electrophoresis, hemoglobinopathy evaluation, vitamin B12, folate, methylmalonic acid, haptoglobin, SPEP. - She denies any blood in her stool or urine. - Labs on 04/24/2019 showed her hemoglobin at 11.9, platelets 224, and ferritin 6.  Creatinine is 0.68.  Vitamin B12 was 467. -We have recommended her starting vitamin B12 oral supplements on her last visit and her B12 inmproved from 260 to 467. -She will continue taking her oral iron tablets. -She reports she is not the best at remembering to take them every day. - We will follow-up with her in 4 months to repeat labs.  2.  Leukopenia: - Likely a normal variant. - WBC initially was 2.9 with a normal differential. -An evaluation of her blood work was done including hepatitis B core antibody, total, hepatitis B surface antibody, qualitative, hepatitis B surface antigen, hepatitis C RNA quantitative, hepatitis panel, acute, HIV antibody, ANA, IFA, and SPEP.  All were negative. - Labs on 04/24/2019 showed her WBC 5.2 -She denies any recurrent infections. -We will continue to monitor.

## 2019-05-01 NOTE — Progress Notes (Signed)
[place], [place] [address]   CLINIC:  Medical Oncology/Hematology  PCP:  Fayrene Helper, MD [address], [place] [address] [phone]   REASON FOR VISIT: Follow-up for iron deficiency anemia  CURRENT THERAPY: Oral iron supplementation  INTERVAL HISTORY:  Dawn [name] 53 y.o. female returns for routine follow-up for iron deficiency anemia.  She reports her energy levels are good and she has no complaints at this time.  She denies any bleeding per rectum or melena.  She denies any easy bruising. Denies any nausea, vomiting, or diarrhea. Denies any new pains. Had not noticed any recent bleeding such as epistaxis, hematuria or hematochezia. Denies recent chest pain on exertion, shortness of breath on minimal exertion, pre-syncopal episodes, or palpitations. Denies any numbness or tingling in hands or feet. Denies any recent fevers, infections, or recent hospitalizations. Patient reports appetite at 100% and energy level at 100%.  She is eating well maintaining her weight at this time.    REVIEW OF SYSTEMS:  Review of Systems  All other systems reviewed and are negative.    PAST MEDICAL/SURGICAL HISTORY:  Past Medical History:  Diagnosis Date  . Anemia   . Anxiety 05/21/2017  . At risk for infectious disease due to recent foreign travel 07/28/2018   pt traveled to [place]  . Burning with urination 08/17/2014  . BV (bacterial vaginosis) 01/21/2014  . Fibroid 02/09/2017  . Lyme disease   . [facility] spotted fever   . Vaginal discharge 01/21/2014  . Vaginal itching 11/03/2015   Past Surgical History:  Procedure Laterality Date  . CESAREAN SECTION    . COLONOSCOPY N/A 09/27/2017   Procedure: COLONOSCOPY;  Surgeon: Rogene Houston, MD;  [facility];  Service: Endoscopy;  Laterality: N/A;  . ESOPHAGOGASTRODUODENOSCOPY N/A 09/27/2017   Procedure: ESOPHAGOGASTRODUODENOSCOPY (EGD);  Surgeon: Rogene Houston,  MD;  [facility];  Service: Endoscopy;  Laterality: N/A;  100-rescheduled to 09/27/17 @ 8:30am per Lelon Frohlich   . TUBAL LIGATION       SOCIAL HISTORY:  Social History   Socioeconomic History  . Marital status: Widowed    Spouse name: Not on file  . Number of children: 2  . Years of education: Not on file  . Highest education level: Not on file  Occupational History  . 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 Financial resource strain: Not hard at all  . Food insecurity    Worry: Never true    Inability: Never true  . Transportation needs    Medical: No    Non-medical: No  Tobacco Use  . Smoking status: Never Smoker  . Smokeless tobacco: Never Used  Substance and Sexual Activity  . Alcohol use: No  . Drug use: No  . Sexual activity: Not Currently    Birth control/protection: Surgical    Comment: tubal  Lifestyle  . Physical activity    Days per week: 2 days    Minutes per session: 30 min  . Stress: Not at all  Relationships  . Social Herbalist on phone: Three times a week    Gets together: Three times a week    Attends religious service: More than 4 times per year    Active member of club or organization: Yes    Attends meetings of clubs or organizations: 1 to 4 times per year    Relationship status:  Widowed  . Intimate partner violence    Fear of current or ex partner: No    Emotionally abused: No    Physically abused: No    Forced sexual activity: No  Other Topics Concern  . Not on file  Social History Narrative  . Not on file    FAMILY HISTORY:  Family History  Problem Relation Age of Onset  . Hypertension Mother   . Hypertension Father   . Cancer Father   . Alzheimer's disease Maternal Grandmother   . Cancer Maternal Grandfather        colon  . Colon cancer Maternal Grandfather   . Cancer Paternal Grandfather        colon  . Colon cancer Paternal Grandfather     CURRENT MEDICATIONS:  Outpatient  Encounter Medications as of 05/01/2019  Medication Sig  . acyclovir (ZOVIRAX) 400 MG tablet Take 1 tablet (400 mg total) by mouth 3 (three) times daily.  . Cyanocobalamin (B-12) 1000 MCG TABS Take 1 tablet by mouth daily.  Marland Kitchen docusate sodium (COLACE) 50 MG capsule Take 1 capsule (50 mg total) by mouth 2 (two) times daily.  . ergocalciferol (VITAMIN D2) 1.25 MG (50000 UT) capsule Take 1 capsule (50,000 Units total) by mouth once a week. One capsule once weekly  . ferrous sulfate 325 (65 FE) MG tablet Take 325 mg by mouth daily with breakfast.   . fluconazole (DIFLUCAN) 150 MG tablet Take one tablet once daily for vaginal itch , as needed   No facility-administered encounter medications on file as of 05/01/2019.     ALLERGIES:  No Known Allergies   Vital signs deferred due to telephone visit.   PHYSICAL EXAM:  ECOG Performance status: 1   Physical Exam Neurological:     Mental Status: She is alert and oriented to person, place, and time. Mental status is at baseline.  Psychiatric:        Mood and Affect: Mood normal.        Behavior: Behavior normal.        Thought Content: Thought content normal.        Judgment: Judgment normal.    Physical assessment deferred due to telephone visit.  LABORATORY DATA:  I have reviewed the labs as listed.  CBC    Component Value Date/Time   WBC 5.2 04/24/2019 0958   RBC 5.58 (H) 04/24/2019 0958   HGB 11.9 (L) 04/24/2019 0958   HCT 40.3 04/24/2019 0958   PLT 224 04/24/2019 0958   MCV 72.2 (L) 04/24/2019 0958   MCH 21.3 (L) 04/24/2019 0958   MCHC 29.5 (L) 04/24/2019 0958   RDW 17.0 (H) 04/24/2019 0958   LYMPHSABS 1.3 04/24/2019 0958   MONOABS 0.5 04/24/2019 0958   EOSABS 0.1 04/24/2019 0958   BASOSABS 0.1 04/24/2019 0958   CMP Latest Ref Rng & Units 04/24/2019 12/16/2018 10/07/2018  Glucose 70 - 99 mg/dL 91 93 84  BUN 6 - 20 mg/dL 14 12 16   Creatinine 0.44 - 1.00 mg/dL 0.68 0.56 0.69  Sodium 135 - 145 mmol/L 139 138 142  Potassium 3.5 -  5.1 mmol/L 4.3 3.4(L) 4.0  Chloride 98 - 111 mmol/L 104 103 105  CO2 22 - 32 mmol/L 28 25 29   Calcium 8.9 - 10.3 mg/dL 9.2 8.8(L) 9.8  Total Protein 6.5 - 8.1 g/dL 7.9 7.5 -  Total Bilirubin 0.3 - 1.2 mg/dL 1.4(H) 0.9 -  Alkaline Phos 38 - 126 U/L 59 54 -  AST 15 - 41  U/L 19 15 -  ALT 0 - 44 U/L 15 12 -     I personally performed a face-to-face visit.    ASSESSMENT & PLAN:   Iron deficiency anemia due to chronic blood loss 1.  Iron deficiency anemia: - Likely due to menorrhagia. - She is currently taking oral iron supplements.  She reports it does cause mild constipation however she is managing this with over-the-counter stool softeners. -She has been seen by her GYN help control her heavy cycles. -She has been seen by GI.  Last EGD and colonoscopy was done on 09/27/2017 by Dr. Laural Golden and it showed normal esophagus, and external hemorrhoids. -Patient had an evaluation of her blood work done including ferritin, LDH, protein electrophoresis, hemoglobinopathy evaluation, vitamin B12, folate, methylmalonic acid, haptoglobin, SPEP. - She denies any blood in her stool or urine. - Labs on 04/24/2019 showed her hemoglobin at 11.9, platelets 224, and ferritin 6.  Creatinine is 0.68.  Vitamin B12 was 467. -We have recommended her starting vitamin B12 oral supplements on her last visit and her B12 inmproved from 260 to 467. -She will continue taking her oral iron tablets. -She reports she is not the best at remembering to take them every day. - We will follow-up with her in 4 months to repeat labs.  2.  Leukopenia: - Likely a normal variant. - WBC initially was 2.9 with a normal differential. -An evaluation of her blood work was done including hepatitis B core antibody, total, hepatitis B surface antibody, qualitative, hepatitis B surface antigen, hepatitis C RNA quantitative, hepatitis panel, acute, HIV antibody, ANA, IFA, and SPEP.  All were negative. - Labs on 04/24/2019 showed her WBC 5.2  -She denies any recurrent infections. -We will continue to monitor.       Orders placed this encounter:  Orders Placed This Encounter  Procedures  . Lactate dehydrogenase  . CBC with Differential/Platelet  . Comprehensive metabolic panel  . Ferritin  . Iron and TIBC  . Vitamin B12  . VITAMIN D 25 Hydroxy (Vit-D Deficiency, Fractures)  . Methylmalonic acid, serum      Francene Finders, FNP-C [place] [phone]

## 2019-06-05 ENCOUNTER — Telehealth: Payer: Self-pay | Admitting: Family Medicine

## 2019-06-05 ENCOUNTER — Other Ambulatory Visit: Payer: Self-pay

## 2019-06-05 ENCOUNTER — Encounter: Payer: Self-pay | Admitting: Family Medicine

## 2019-06-05 ENCOUNTER — Ambulatory Visit (INDEPENDENT_AMBULATORY_CARE_PROVIDER_SITE_OTHER): Payer: 59 | Admitting: Family Medicine

## 2019-06-05 VITALS — BP 120/66

## 2019-06-05 DIAGNOSIS — R42 Dizziness and giddiness: Secondary | ICD-10-CM | POA: Diagnosis not present

## 2019-06-05 DIAGNOSIS — R197 Diarrhea, unspecified: Secondary | ICD-10-CM

## 2019-06-05 DIAGNOSIS — R110 Nausea: Secondary | ICD-10-CM | POA: Diagnosis not present

## 2019-06-05 NOTE — Patient Instructions (Signed)
    I appreciate the opportunity to provide you with the care for your health and wellness. Today we discussed: Dizziness, nausea, diarrhea  Follow up: As previously scheduled on October 26  No labs or referrals today  Please eat bananas and hydrate well over the next couple days.  If you have the symptoms return please take your blood pressure and let us know what it is.  If this is related to dehydration sometimes her blood pressure can drop.  Or he can feel sick.  Additionally if you ate something wrong or if you are having diarrhea sometimes the body can have a reaction that makes you feel nauseated lightheaded or like you are going to pass out.  And it usually resolves after having the diarrhea.  If diarrhea sustains greater than 2 to 3 days please utilize over-the-counter methods such as Imodium if that does not help please call the doctor's office.  Please continue to practice social distancing to keep you, your family, and our community safe.  If you must go out, please wear a Mask and practice good handwashing.  GET FRESH AIR DAILY. STAY HYDRATED WITH WATER.   It was a pleasure to see you and I look forward to continuing to work together on your health and well-being. Please do not hesitate to call the office if you need care or have questions about your care.  Have a wonderful day and week. With Gratitude, Cherly Beach, DNP, AGNP-BC

## 2019-06-05 NOTE — Progress Notes (Addendum)
Virtual Visit via Telephone Note   This visit type was conducted due to national recommendations for restrictions regarding the COVID-19 Pandemic (e.g. social distancing) in an effort to limit this patient's exposure and mitigate transmission in our community.  Due to her co-morbid illnesses, this patient is at least at moderate risk for complications without adequate follow up.  This format is felt to be most appropriate for this patient at this time.  The patient did not have access to video technology/had technical difficulties with video requiring transitioning to audio format only (telephone).  All issues noted in this document were discussed and addressed.  No physical exam could be performed with this format.    Evaluation Performed:  Follow-up visit  Date:  06/05/2019   ID:  Dawn [name], DOB 08-31-1965, MRN [id number]  Patient Location: Home Provider Location: Office  Location of Patient: Home Location of Provider: Telehealth Consent was obtain for visit to be over via telehealth. I verified that I am speaking with the correct person using two identifiers.  PCP:  Fayrene Helper, MD   Chief Complaint: Lightheadedness and dizziness, nausea  History of Present Illness:    Dawn [name] is a 53 y.o. female with history of anxiety, fibroids, Lyme disease, [place] spotted fever, anemia among others.  Reports that she started having some heart fluttering, dizziness, lightheadedness that started yesterday evening.  Was worse this morning.  Reported that she laid down she kind of felt like the room was spinning.  She reports that she has had this happen before was treated at the emergency room with some fluids.  She reports 2 loose stools denies blood in her stool.  Denies eating anything that would have caused this.  Denies being around anybody that had any recent illness.  Denies having any sinus issues.  Denies having any migraine. ng down, room was spinning kind of.   By the time that she had this appointment she was feeling much better.  She does not have her blood pressure cuff with her as she has not home.  But she will take her blood pressure and report back. The patient does not have symptoms concerning for COVID-19 infection (fever, chills, cough, or new shortness of breath).   Past Medical, Surgical, Social History, Allergies, and Medications have been Reviewed.   Past Medical History:  Diagnosis Date  . Anemia   . Anxiety 05/21/2017  . At risk for infectious disease due to recent foreign travel 07/28/2018   pt traveled to [place]  . Burning with urination 08/17/2014  . BV (bacterial vaginosis) 01/21/2014  . Fibroid 02/09/2017  . Lyme disease   . [facility] spotted fever   . Vaginal discharge 01/21/2014  . Vaginal itching 11/03/2015   Past Surgical History:  Procedure Laterality Date  . CESAREAN SECTION    . COLONOSCOPY N/A 09/27/2017   Procedure: COLONOSCOPY;  Surgeon: Rogene Houston, MD;  [facility];  Service: Endoscopy;  Laterality: N/A;  . ESOPHAGOGASTRODUODENOSCOPY N/A 09/27/2017   Procedure: ESOPHAGOGASTRODUODENOSCOPY (EGD);  Surgeon: Rogene Houston, MD;  [facility];  Service: Endoscopy;  Laterality: N/A;  100-rescheduled to 09/27/17 @ 8:30am per Lelon Frohlich   . TUBAL LIGATION       Current Meds  Medication Sig  . acyclovir (ZOVIRAX) 400 MG tablet Take 1 tablet (400 mg total) by mouth 3 (three) times daily.  . Cyanocobalamin (B-12) 1000 MCG TABS Take 1 tablet by mouth daily.  Marland Kitchen  docusate sodium (COLACE) 50 MG capsule Take 1 capsule (50 mg total) by mouth 2 (two) times daily.  . ergocalciferol (VITAMIN D2) 1.25 MG (50000 UT) capsule Take 1 capsule (50,000 Units total) by mouth once a week. One capsule once weekly  . ferrous sulfate 325 (65 FE) MG tablet Take 325 mg by mouth daily with breakfast.   . fluconazole (DIFLUCAN) 150 MG tablet Take one tablet once daily for vaginal itch , as needed     Allergies:    Patient has no known allergies.   Social History   Tobacco Use  . Smoking status: Never Smoker  . Smokeless tobacco: Never Used  Substance Use Topics  . Alcohol use: No  . Drug use: No     Family Hx: The patient's family history includes Alzheimer's disease in her maternal grandmother; Cancer in her father, maternal grandfather, and paternal grandfather; Colon cancer in her maternal grandfather and paternal grandfather; Hypertension in her father and mother.  ROS:   Please see the history of present illness.    All other systems reviewed and are negative.   Labs/Other Tests and Data Reviewed:      Recent Labs: 04/24/2019: ALT 15; BUN 14; Creatinine, Ser 0.68; Hemoglobin 11.9; Platelets 224; Potassium 4.3; Sodium 139; TSH 1.187   Recent Lipid Panel Lab Results  Component Value Date/Time   CHOL 213 (H) 10/07/2018 08:50 AM   TRIG 57 10/07/2018 08:50 AM   HDL 85 10/07/2018 08:50 AM   CHOLHDL 2.5 10/07/2018 08:50 AM   LDLCALC 114 (H) 10/07/2018 08:50 AM    Wt Readings from Last 3 Encounters:  03/20/19 149 lb (67.6 kg)  12/23/18 151 lb (68.5 kg)  10/22/18 144 lb 4.8 oz (65.5 kg)     Objective:    Vital Signs:  BP 120/66 Comment: taken at home   GEN:  alert and oriented RESPIRATORY:  no shortness of breath in conversation  PSYCH:  normal mood and affect  ASSESSMENT & PLAN:    1. Episodic lightheadedness Questionable lightheadedness.  Versus dizziness, vertigo.  Reports she is had this before and she was told that her sodium was a little bit low.  Was in the emergency room given her some fluids.  She is advised today to eat some bananas and to hydrate well.  If she continues to have the feeling or if it comes back to take her blood pressure to see if it is blood pressure related.  2. Nausea She does not have any more nausea at this time.  She never had any vomiting.  Advised to eat a banana and get hydrated to see if this is dehydration related.  3. Diarrhea,  unspecified type Unsure of if she had a visceral reaction and it just caused her to feel lightheaded and crampy and have dizziness and nausea secondary to having diarrhea.  Possible 24-hour bug.  Unsure she reports she has not eaten anything that would have caused it.  Advised to eat a banana and hydrate well and see if that helps keep this from returning.   Time:   Today, I have spent 10 minutes with the patient with telehealth technology discussing the above problems.     Medication Adjustments/Labs and Tests Ordered: Current medicines are reviewed at length with the patient today.  Concerns regarding medicines are outlined above.   Tests Ordered: No orders of the defined types were placed in this encounter.   Medication Changes: No orders of the defined types were placed in this  encounter.   Disposition:  Follow up 06/23/2019 or PRN  Signed, Perlie Mayo, NP  06/05/2019 1:00 PM     [place]

## 2019-06-05 NOTE — Telephone Encounter (Signed)
Pt called back with BP reading 120/66

## 2019-06-23 ENCOUNTER — Ambulatory Visit: Payer: 59 | Admitting: Family Medicine

## 2019-07-21 ENCOUNTER — Encounter (HOSPITAL_COMMUNITY): Payer: Self-pay | Admitting: Emergency Medicine

## 2019-07-21 ENCOUNTER — Emergency Department (HOSPITAL_COMMUNITY): Payer: 59

## 2019-07-21 ENCOUNTER — Emergency Department (HOSPITAL_COMMUNITY)
Admission: EM | Admit: 2019-07-21 | Discharge: 2019-07-21 | Disposition: A | Discharge status: Home / Self Care | Payer: 59 | Attending: Emergency Medicine | Admitting: Emergency Medicine

## 2019-07-21 ENCOUNTER — Other Ambulatory Visit: Payer: Self-pay

## 2019-07-21 DIAGNOSIS — Z79899 Other long term (current) drug therapy: Secondary | ICD-10-CM | POA: Insufficient documentation

## 2019-07-21 DIAGNOSIS — Z20828 Contact with and (suspected) exposure to other viral communicable diseases: Secondary | ICD-10-CM | POA: Diagnosis not present

## 2019-07-21 DIAGNOSIS — R0602 Shortness of breath: Secondary | ICD-10-CM | POA: Insufficient documentation

## 2019-07-21 DIAGNOSIS — A09 Infectious gastroenteritis and colitis, unspecified: Secondary | ICD-10-CM

## 2019-07-21 DIAGNOSIS — R197 Diarrhea, unspecified: Secondary | ICD-10-CM | POA: Diagnosis present

## 2019-07-21 LAB — COMPREHENSIVE METABOLIC PANEL
ALT: 15 U/L (ref 0–44)
AST: 17 U/L (ref 15–41)
Albumin: 4.4 g/dL (ref 3.5–5.0)
Alkaline Phosphatase: 45 U/L (ref 38–126)
Anion gap: 10 (ref 5–15)
BUN: <5 mg/dL — ABNORMAL LOW (ref 6–20)
CO2: 23 mmol/L (ref 22–32)
Calcium: 9.5 mg/dL (ref 8.9–10.3)
Chloride: 105 mmol/L (ref 98–111)
Creatinine, Ser: 0.58 mg/dL (ref 0.44–1.00)
GFR calc Af Amer: >60 mL/min (ref >60)
GFR calc non Af Amer: >60 mL/min (ref >60)
Glucose, Bld: 104 mg/dL — ABNORMAL HIGH (ref 70–99)
Potassium: 3.2 mmol/L — ABNORMAL LOW (ref 3.5–5.1)
Sodium: 138 mmol/L (ref 135–145)
Total Bilirubin: 1.2 mg/dL (ref 0.3–1.2)
Total Protein: 8.7 g/dL — ABNORMAL HIGH (ref 6.5–8.1)

## 2019-07-21 LAB — CBC WITH DIFFERENTIAL/PLATELET
Abs Immature Granulocytes: 0.01 10*3/uL (ref 0.00–0.07)
Basophils Absolute: 0.1 10*3/uL (ref 0.0–0.1)
Basophils Relative: 2 %
Eosinophils Absolute: 0 10*3/uL (ref 0.0–0.5)
Eosinophils Relative: 0 %
HCT: 37.3 % (ref 36.0–46.0)
Hemoglobin: 10.8 g/dL — ABNORMAL LOW (ref 12.0–15.0)
Immature Granulocytes: 0 %
Lymphocytes Relative: 23 %
Lymphs Abs: 1.3 10*3/uL (ref 0.7–4.0)
MCH: 20.1 pg — ABNORMAL LOW (ref 26.0–34.0)
MCHC: 29 g/dL — ABNORMAL LOW (ref 30.0–36.0)
MCV: 69.6 fL — ABNORMAL LOW (ref 80.0–100.0)
Monocytes Absolute: 0.5 10*3/uL (ref 0.1–1.0)
Monocytes Relative: 9 %
Neutro Abs: 3.7 10*3/uL (ref 1.7–7.7)
Neutrophils Relative %: 66 %
Platelets: 270 10*3/uL (ref 150–400)
RBC: 5.36 MIL/uL — ABNORMAL HIGH (ref 3.87–5.11)
RDW: 17 % — ABNORMAL HIGH (ref 11.5–15.5)
WBC: 5.6 10*3/uL (ref 4.0–10.5)
nRBC: 0 % (ref 0.0–0.2)

## 2019-07-21 LAB — POC URINE PREG, ED: Preg Test, Ur: NEGATIVE

## 2019-07-21 LAB — POC SARS CORONAVIRUS 2 AG -  ED: SARS Coronavirus 2 Ag: NEGATIVE

## 2019-07-21 MED ORDER — POTASSIUM CHLORIDE CRYS ER 20 MEQ PO TBCR
40.0000 meq | EXTENDED_RELEASE_TABLET | Freq: Once | ORAL | Status: AC
  Administered 2019-07-21: 40 meq via ORAL
  Filled 2019-07-21: qty 2

## 2019-07-21 NOTE — ED Provider Notes (Signed)
[facility] Provider Note   CSN: [id number] Arrival date & time: 07/21/19  1821     History   Chief Complaint Chief Complaint  Patient presents with  . Shortness of Breath    HPI MARIAINES SAWHILL is a 53 y.o. female.     Patient complains of diarrhea and aches.  The history is provided by the patient. No language interpreter was used.  Diarrhea Quality:  Semi-solid Severity:  Moderate Onset quality:  Sudden Timing:  Constant Progression:  Worsening Relieved by:  Nothing Worsened by:  Nothing Ineffective treatments:  None tried Associated symptoms: abdominal pain   Associated symptoms: no headaches     Past Medical History:  Diagnosis Date  . Anemia   . Anxiety 05/21/2017  . At risk for infectious disease due to recent foreign travel 07/28/2018   pt traveled to [place]  . Burning with urination 08/17/2014  . BV (bacterial vaginosis) 01/21/2014  . Fibroid 02/09/2017  . Lyme disease   . [facility] spotted fever   . Vaginal discharge 01/21/2014  . Vaginal itching 11/03/2015    Patient Active Problem List   Diagnosis Date Noted  . 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 Vaginitis 03/20/2019  . Hemorrhoids 03/20/2019  . Immunization refused 10/07/2018  . At risk for infectious disease due to recent foreign travel 05/21/2017  . Exposure to parasitic disease 04/30/2017  . Absolute anemia 03/28/2017  . Positive Lyme disease serology 03/22/2017  . [facility] spotted fever) 03/22/2017  . Fibroid 02/09/2017  . Enlarged uterus 01/30/2017  . Iron deficiency anemia due to chronic blood loss 01/30/2017  . Menorrhagia with regular cycle 01/30/2017  . 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 Vitamin D deficiency 12/28/2008    Past Surgical History:  Procedure Laterality Date  . CESAREAN SECTION    . COLONOSCOPY N/A 09/27/2017   Procedure: COLONOSCOPY;  Surgeon: Rogene Houston, MD;  [facility];  Service: Endoscopy;  Laterality:  N/A;  . ESOPHAGOGASTRODUODENOSCOPY N/A 09/27/2017   Procedure: ESOPHAGOGASTRODUODENOSCOPY (EGD);  Surgeon: Rogene Houston, MD;  [facility];  Service: Endoscopy;  Laterality: N/A;  100-rescheduled to 09/27/17 @ 8:30am per Lelon Frohlich   . TUBAL LIGATION       OB History    Gravida  2   Para  2   Term  2   Preterm      AB      Living  2     SAB      TAB      Ectopic      Multiple      Live Births  2            Home Medications    Prior to Admission medications   Medication Sig Start Date End Date Taking? Authorizing Provider  Cyanocobalamin (B-12) 1000 MCG TABS Take 1 tablet by mouth daily.   Yes [provider]  ferrous sulfate 325 (65 FE) MG tablet Take 325 mg by mouth daily with breakfast.    Yes [provider]  acyclovir (ZOVIRAX) 400 MG tablet Take 1 tablet (400 mg total) by mouth 3 (three) times daily. Patient not taking: Reported on 07/21/2019 03/24/19   Fayrene Helper, MD  docusate sodium (COLACE) 50 MG capsule Take 1 capsule (50 mg total) by mouth 2 (two) times daily. Patient not taking: Reported on 07/21/2019 03/20/19   Fayrene Helper, MD    Family History Family History  Problem Relation  Age of Onset  . Hypertension Mother   . Hypertension Father   . Cancer Father   . Alzheimer's disease Maternal Grandmother   . Cancer Maternal Grandfather        colon  . Colon cancer Maternal Grandfather   . Cancer Paternal Grandfather        colon  . Colon cancer Paternal Grandfather     Social History Social History   Tobacco Use  . Smoking status: Never Smoker  . Smokeless tobacco: Never Used  Substance Use Topics  . Alcohol use: No  . Drug use: No     Allergies   Patient has no known allergies.   Review of Systems Review of Systems  Constitutional: Negative for appetite change and fatigue.  HENT: Negative for congestion, ear discharge and sinus pressure.   Eyes: Negative for discharge.  Respiratory:  Negative for cough.   Cardiovascular: Negative for chest pain.  Gastrointestinal: Positive for abdominal pain and diarrhea.  Genitourinary: Negative for frequency and hematuria.  Musculoskeletal: Negative for back pain.  Skin: Negative for rash.  Neurological: Negative for seizures and headaches.  Psychiatric/Behavioral: Negative for hallucinations.     Physical Exam Updated Vital Signs BP 122/63 (BP Location: Right Arm)   Pulse 87   Temp 99.3 F (37.4 C) (Oral)   Resp 17   Ht 5\' 5"  (1.651 m)   Wt 71.7 kg   LMP 07/03/2019   SpO2 100%   BMI 26.29 kg/m   Physical Exam Vitals signs and nursing note reviewed.  Constitutional:      Appearance: She is well-developed.  HENT:     Head: Normocephalic.     Nose: Nose normal.  Eyes:     General: No scleral icterus.    Conjunctiva/sclera: Conjunctivae normal.  Neck:     Musculoskeletal: Neck supple.     Thyroid: No thyromegaly.  Cardiovascular:     Rate and Rhythm: Normal rate and regular rhythm.     Heart sounds: No murmur. No friction rub. No gallop.   Pulmonary:     Breath sounds: No stridor. No wheezing or rales.  Chest:     Chest wall: No tenderness.  Abdominal:     General: There is no distension.     Tenderness: There is abdominal tenderness. There is no rebound.  Musculoskeletal: Normal range of motion.  Lymphadenopathy:     Cervical: No cervical adenopathy.  Skin:    Findings: No erythema or rash.  Neurological:     Mental Status: She is alert and oriented to person, place, and time.     Motor: No abnormal muscle tone.     Coordination: Coordination normal.  Psychiatric:        Behavior: Behavior normal.      ED Treatments / Results  Labs (all labs ordered are listed, but only abnormal results are displayed) Labs Reviewed  CBC WITH DIFFERENTIAL/PLATELET - Abnormal; Notable for the following components:      Result Value   RBC 5.36 (*)    Hemoglobin 10.8 (*)    MCV 69.6 (*)    MCH 20.1 (*)    MCHC  29.0 (*)    RDW 17.0 (*)    All other components within normal limits  COMPREHENSIVE METABOLIC PANEL - Abnormal; Notable for the following components:   Potassium 3.2 (*)    Glucose, Bld 104 (*)    BUN <5 (*)    Total Protein 8.7 (*)    All other components within normal  limits  POC SARS CORONAVIRUS 2 AG -  ED  POC URINE PREG, ED    EKG None  Radiology Acute Abd Series  Result Date: 07/21/2019 CLINICAL DATA:  Diarrhea and shortness of breath EXAM: DG ABDOMEN ACUTE W/ 1V CHEST COMPARISON:  None. FINDINGS: Cardiac shadow is within normal limits. Lungs are well aerated bilaterally without focal infiltrate or sizable effusion. Scattered large and small bowel gas is noted. No abnormal mass or abnormal calcifications are seen. No acute bony abnormality is noted. IMPRESSION: No acute abnormality noted. Electronically Signed   By: Inez Catalina M.D.   On: 07/21/2019 22:05    Procedures Procedures (including critical care time)  Medications Ordered in ED Medications  potassium chloride SA (KLOR-CON) CR tablet 40 mEq (has no administration in time range)     Initial Impression / Assessment and Plan / ED Course  I have reviewed the triage vital signs and the nursing notes.  Pertinent labs & imaging results that were available during my care of the patient were reviewed by me and considered in my medical decision making (see chart for details).        Labs unremarkable including rapid Covid test.  Suspect viral syndrome.  Patient told to drink plenty of fluids and take Imodium for diarrhea and follow-up if not improving Final Clinical Impressions(s) / ED Diagnoses   Final diagnoses:  Diarrhea of infectious origin    ED Discharge Orders    None       Milton Ferguson, MD 07/21/19 2238

## 2019-07-21 NOTE — ED Triage Notes (Signed)
Pt c/o SOB, shaky, fatigue, "tingly feeling all over body", diarrhea starting at 0200 this morning

## 2019-07-21 NOTE — Discharge Instructions (Addendum)
Drink plenty of fluids.  Take Tylenol for any aches and pains and take Imodium for diarrhea.  Follow-up with your family doctor if not improved

## 2019-07-22 DIAGNOSIS — Z79899 Other long term (current) drug therapy: Secondary | ICD-10-CM | POA: Insufficient documentation

## 2019-07-22 DIAGNOSIS — R002 Palpitations: Secondary | ICD-10-CM | POA: Diagnosis present

## 2019-07-22 DIAGNOSIS — R0789 Other chest pain: Secondary | ICD-10-CM | POA: Diagnosis not present

## 2019-07-23 ENCOUNTER — Other Ambulatory Visit: Payer: Self-pay

## 2019-07-23 ENCOUNTER — Encounter (HOSPITAL_COMMUNITY): Payer: Self-pay | Admitting: *Deleted

## 2019-07-23 ENCOUNTER — Emergency Department (HOSPITAL_COMMUNITY)
Admission: EM | Admit: 2019-07-23 | Discharge: 2019-07-23 | Disposition: A | Discharge status: Home / Self Care | Payer: 59 | Attending: Emergency Medicine | Admitting: Emergency Medicine

## 2019-07-23 DIAGNOSIS — R002 Palpitations: Secondary | ICD-10-CM

## 2019-07-23 LAB — BASIC METABOLIC PANEL
Anion gap: 12 (ref 5–15)
BUN: 10 mg/dL (ref 6–20)
CO2: 22 mmol/L (ref 22–32)
Calcium: 9.5 mg/dL (ref 8.9–10.3)
Chloride: 102 mmol/L (ref 98–111)
Creatinine, Ser: 0.69 mg/dL (ref 0.44–1.00)
GFR calc Af Amer: >60 mL/min (ref >60)
GFR calc non Af Amer: >60 mL/min (ref >60)
Glucose, Bld: 100 mg/dL — ABNORMAL HIGH (ref 70–99)
Potassium: 3.9 mmol/L (ref 3.5–5.1)
Sodium: 136 mmol/L (ref 135–145)

## 2019-07-23 LAB — CBC
HCT: 38.6 % (ref 36.0–46.0)
Hemoglobin: 11.2 g/dL — ABNORMAL LOW (ref 12.0–15.0)
MCH: 20.3 pg — ABNORMAL LOW (ref 26.0–34.0)
MCHC: 29 g/dL — ABNORMAL LOW (ref 30.0–36.0)
MCV: 69.8 fL — ABNORMAL LOW (ref 80.0–100.0)
Platelets: 263 10*3/uL (ref 150–400)
RBC: 5.53 MIL/uL — ABNORMAL HIGH (ref 3.87–5.11)
RDW: 17.4 % — ABNORMAL HIGH (ref 11.5–15.5)
WBC: 7.4 10*3/uL (ref 4.0–10.5)
nRBC: 0 % (ref 0.0–0.2)

## 2019-07-23 LAB — TROPONIN I (HIGH SENSITIVITY)
Troponin I (High Sensitivity): <2 ng/L (ref <18)
Troponin I (High Sensitivity): <2 ng/L (ref <18)

## 2019-07-23 LAB — D-DIMER, QUANTITATIVE: D-Dimer, Quant: 0.34 ug/mL-FEU (ref 0.00–0.50)

## 2019-07-23 MED ORDER — SODIUM CHLORIDE 0.9 % IV BOLUS
1000.0000 mL | Freq: Once | INTRAVENOUS | Status: AC
  Administered 2019-07-23: 1000 mL via INTRAVENOUS

## 2019-07-23 MED ORDER — ALPRAZOLAM 0.5 MG PO TABS: 0.5000 mg | ORAL_TABLET | Freq: Two times a day (BID) | ORAL | 0 refills | Status: DC | PRN

## 2019-07-23 MED ORDER — ALPRAZOLAM 0.5 MG PO TABS
0.5000 mg | ORAL_TABLET | Freq: Once | ORAL | Status: AC
  Administered 2019-07-23: 0.5 mg via ORAL
  Filled 2019-07-23: qty 1

## 2019-07-23 NOTE — ED Notes (Signed)
Called lab for blood draw on pt.

## 2019-07-23 NOTE — ED Triage Notes (Signed)
Pt c/o feeling like her heart is racing; pt was seen last night for same complaints of feeling like she is "shaky all over" and "tingly";

## 2019-07-23 NOTE — ED Provider Notes (Signed)
[facility] Provider Note   CSN: [id number] Arrival date & time: 07/22/19  2354     History   Chief Complaint Chief Complaint  Patient presents with  . Palpitations    HPI Dawn [name] is a 53 y.o. female.     Patient presents to the emergency department for evaluation of chest tightness, anxiousness, feeling numb and tingly all over with heart palpitations and racing heartbeat.  Patient was seen in the ER yesterday with similar symptoms and diarrhea.  She did have a diarrhea stool right before the symptoms occurred again tonight.  No rectal bleeding or melena.     Past Medical History:  Diagnosis Date  . Anemia   . Anxiety 05/21/2017  . At risk for infectious disease due to recent foreign travel 07/28/2018   pt traveled to [place]  . Burning with urination 08/17/2014  . BV (bacterial vaginosis) 01/21/2014  . Fibroid 02/09/2017  . Lyme disease   . [facility] spotted fever   . Vaginal discharge 01/21/2014  . Vaginal itching 11/03/2015    Patient Active Problem List   Diagnosis Date Noted  . 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 Vaginitis 03/20/2019  . Hemorrhoids 03/20/2019  . Immunization refused 10/07/2018  . At risk for infectious disease due to recent foreign travel 05/21/2017  . Exposure to parasitic disease 04/30/2017  . Absolute anemia 03/28/2017  . Positive Lyme disease serology 03/22/2017  . [facility] spotted fever) 03/22/2017  . Fibroid 02/09/2017  . Enlarged uterus 01/30/2017  . Iron deficiency anemia due to chronic blood loss 01/30/2017  . Menorrhagia with regular cycle 01/30/2017  . 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 Vitamin D deficiency 12/28/2008    Past Surgical History:  Procedure Laterality Date  . CESAREAN SECTION    . COLONOSCOPY N/A 09/27/2017   Procedure: COLONOSCOPY;  Surgeon: Rogene Houston, MD;  Location: [facility];  Service: Endoscopy;  Laterality: N/A;  .  ESOPHAGOGASTRODUODENOSCOPY N/A 09/27/2017   Procedure: ESOPHAGOGASTRODUODENOSCOPY (EGD);  Surgeon: Rogene Houston, MD;  Location: [facility];  Service: Endoscopy;  Laterality: N/A;  100-rescheduled to 09/27/17 @ 8:30am per Lelon Frohlich   . TUBAL LIGATION       OB History    Gravida  2   Para  2   Term  2   Preterm      AB      Living  2     SAB      TAB      Ectopic      Multiple      Live Births  2            Home Medications    Prior to Admission medications   Medication Sig Start Date End Date Taking? Authorizing Provider  acyclovir (ZOVIRAX) 400 MG tablet Take 1 tablet (400 mg total) by mouth 3 (three) times daily. Patient not taking: Reported on 07/21/2019 03/24/19   Fayrene Helper, MD  Cyanocobalamin (B-12) 1000 MCG TABS Take 1 tablet by mouth daily.    [provider]  docusate sodium (COLACE) 50 MG capsule Take 1 capsule (50 mg total) by mouth 2 (two) times daily. Patient not taking: Reported on 07/21/2019 03/20/19   Fayrene Helper, MD  ferrous sulfate 325 (65 FE) MG tablet Take 325 mg by mouth daily with breakfast.     [provider]    Family History Family History  Problem Relation Age of Onset  .  Hypertension Mother   . Hypertension Father   . Cancer Father   . Alzheimer's disease Maternal Grandmother   . Cancer Maternal Grandfather        colon  . Colon cancer Maternal Grandfather   . Cancer Paternal Grandfather        colon  . Colon cancer Paternal Grandfather     Social History Social History   Tobacco Use  . Smoking status: Never Smoker  . Smokeless tobacco: Never Used  Substance Use Topics  . Alcohol use: No  . Drug use: No     Allergies   Patient has no known allergies.   Review of Systems Review of Systems  Cardiovascular: Positive for palpitations.  Gastrointestinal: Positive for diarrhea.  All other systems reviewed and are negative.    Physical Exam Updated Vital Signs BP 132/75    Pulse (!) 102   Temp 98.9 F (37.2 C) (Oral)   Resp 16   Ht 5\' 5"  (1.651 m)   Wt 71.7 kg   LMP 07/03/2019   SpO2 100%   BMI 26.29 kg/m   Physical Exam Vitals signs and nursing note reviewed.  Constitutional:      General: She is not in acute distress.    Appearance: Normal appearance. She is well-developed.  HENT:     Head: Normocephalic and atraumatic.     Right Ear: Hearing normal.     Left Ear: Hearing normal.     Nose: Nose normal.  Eyes:     Conjunctiva/sclera: Conjunctivae normal.     Pupils: Pupils are equal, round, and reactive to light.  Neck:     Musculoskeletal: Normal range of motion and neck supple.  Cardiovascular:     Rate and Rhythm: Regular rhythm. Tachycardia present.     Heart sounds: S1 normal and S2 normal. No murmur. No friction rub. No gallop.   Pulmonary:     Effort: Pulmonary effort is normal. No respiratory distress.     Breath sounds: Normal breath sounds.  Chest:     Chest wall: No tenderness.  Abdominal:     General: Bowel sounds are normal.     Palpations: Abdomen is soft.     Tenderness: There is no abdominal tenderness. There is no guarding or rebound. Negative signs include Murphy's sign and McBurney's sign.     Hernia: No hernia is present.  Musculoskeletal: Normal range of motion.  Skin:    General: Skin is warm and dry.     Findings: No rash.  Neurological:     Mental Status: She is alert and oriented to person, place, and time.     GCS: GCS eye subscore is 4. GCS verbal subscore is 5. GCS motor subscore is 6.     Cranial Nerves: No cranial nerve deficit.     Sensory: No sensory deficit.     Coordination: Coordination normal.  Psychiatric:        Mood and Affect: Mood is anxious.        Speech: Speech normal.        Behavior: Behavior normal.        Thought Content: Thought content normal.      ED Treatments / Results  Labs (all labs ordered are listed, but only abnormal results are displayed) Labs Reviewed  CBC -  Abnormal; Notable for the following components:      Result Value   RBC 5.53 (*)    Hemoglobin 11.2 (*)    MCV 69.8 (*)  MCH 20.3 (*)    MCHC 29.0 (*)    RDW 17.4 (*)    All other components within normal limits  BASIC METABOLIC PANEL - Abnormal; Notable for the following components:   Glucose, Bld 100 (*)    All other components within normal limits  D-DIMER, QUANTITATIVE (NOT AT [facility])  TROPONIN I (HIGH SENSITIVITY)  TROPONIN I (HIGH SENSITIVITY)    EKG EKG Interpretation  Date/Time:  Wednesday July 23 2019 02:54:07 EST Ventricular Rate:  109 PR Interval:  134 QRS Duration: 70 QT Interval:  324 QTC Calculation: 436 R Axis:   57 Text Interpretation: Sinus tachycardia Biatrial enlargement Abnormal ECG Confirmed by Orpah Greek [phone]) on 07/23/2019 3:34:31 AM   Radiology Acute Abd Series  Result Date: 07/21/2019 CLINICAL DATA:  Diarrhea and shortness of breath EXAM: DG ABDOMEN ACUTE W/ 1V CHEST COMPARISON:  None. FINDINGS: Cardiac shadow is within normal limits. Lungs are well aerated bilaterally without focal infiltrate or sizable effusion. Scattered large and small bowel gas is noted. No abnormal mass or abnormal calcifications are seen. No acute bony abnormality is noted. IMPRESSION: No acute abnormality noted. Electronically Signed   By: Inez Catalina M.D.   On: 07/21/2019 22:05    Procedures Procedures (including critical care time)  Medications Ordered in ED Medications  sodium chloride 0.9 % bolus 1,000 mL (0 mLs Intravenous Stopped 07/23/19 0541)  ALPRAZolam (XANAX) tablet 0.5 mg (0.5 mg Oral Given 07/23/19 0550)     Initial Impression / Assessment and Plan / ED Course  I have reviewed the triage vital signs and the nursing notes.  Pertinent labs & imaging results that were available during my care of the patient were reviewed by me and considered in my medical decision making (see chart for details).        Patient presents to the  emergency department for evaluation of tightness in her chest with racing heartbeat.  She was seen in the ER yesterday with similar symptoms including diarrhea.  She had low potassium yesterday, this was repleted.  Describes sensation of feeling tingling all over, tightness in her chest, racing heartbeat and this seems suspicious for possible anxiety and panic attack.  Work-up was performed to rule out other etiology.  No evidence of cardiac etiology, no arrhythmia and EKG, troponins unremarkable.  Patient was persistently tachycardic.  She was given IV fluids, as she has had recent diarrhea but fluid resuscitation did not completely resolve tachycardia.  Patient did seem to have normal resting heart rate but increased with evaluation, supporting diagnosis of anxiety.  A D-dimer was added on to further evaluate.  This was normal.  As she does not have any specific PE risk factors, it is felt that normal D-dimer can be utilized to rule this out.  Patient will be discharged to follow-up with her primary doctor, emergent condition has been ruled out today.  Final Clinical Impressions(s) / ED Diagnoses   Final diagnoses:  Heart palpitations    ED Discharge Orders    None       Ciel Chervenak, Gwenyth Allegra, MD 07/23/19 [id number]

## 2019-07-30 ENCOUNTER — Encounter: Payer: Self-pay | Admitting: Family Medicine

## 2019-07-30 ENCOUNTER — Ambulatory Visit (INDEPENDENT_AMBULATORY_CARE_PROVIDER_SITE_OTHER): Payer: 59 | Admitting: Family Medicine

## 2019-07-30 ENCOUNTER — Other Ambulatory Visit: Payer: Self-pay

## 2019-07-30 VITALS — BP 126/63 | Ht 65.0 in | Wt 158.0 lb

## 2019-07-30 DIAGNOSIS — F410 Panic disorder [episodic paroxysmal anxiety] without agoraphobia: Secondary | ICD-10-CM | POA: Diagnosis not present

## 2019-07-30 NOTE — Assessment & Plan Note (Addendum)
Had 2 episodes 1 week ago, which necessitated two ED visits one week ago. Pt clearly describes panic , reports good response to xanax given in the ED, and has not taken any since as she has not felt the need and is aware that  xanax is addictive. Behavioral techniques  For anxiety are reviewed

## 2019-07-30 NOTE — Progress Notes (Signed)
Virtual Visit via Telephone Note  I connected with Dawn [name] on 07/30/19 at  9:00 AM EST by telephone and verified that I am speaking with the correct person using two identifiers.  Location: Patient: home Provider: Office   I discussed the limitations, risks, security and privacy concerns of performing an evaluation and management service by telephone and the availability of in person appointments. I also discussed with the patient that there may be a patient responsible charge related to this service. The patient expressed understanding and agreed to proceed.   History of Present Illness: Pt in for f/u 2 recent ED visits , 11/23 and 11/25, with dx of diarrhea initially and then anxiety / panic I reviewed the history and labs from the ED visits as well as her h/o feeling sad the morning of 11/23 and crying and later had palpitations, HYPERVENTILATION, PERI ORBIRTAL TINGLING AS WELL AS TINGLING IN HER FINGERS Both GAD and PHQ 9 scores are negative    Observations/Objective: BP 126/63   Ht 5\' 5"  (1.651 m)   Wt 158 lb (71.7 kg)   LMP 07/03/2019   BMI 26.29 kg/m  Good communication with no confusion and intact memory. Alert and oriented x 3 No signs of respiratory distress during speech    Assessment and Plan:   Follow Up Instructions:    I discussed the assessment and treatment plan with the patient. The patient was provided an opportunity to ask questions and all were answered. The patient agreed with the plan and demonstrated an understanding of the instructions.   The patient was advised to call back or seek an in-person evaluation if the symptoms worsen or if the condition fails to improve as anticipated.  I provided 15 minutes of non-face-to-face time during this encounter.   Tula Nakayama, MD

## 2019-07-30 NOTE — Patient Instructions (Addendum)
F/U in office with MD in 5 months, reassess panic attacks, call if you need me sooner  Please schedule mid morning appt for mammogram   Please call and come for your influenza vaccine when you decide you are going to take it.  This is very important in helping to protect you from pneumonia due to influenza.  What you describe is clearly panic attacks as we have discussed there are ways to deal with this trying to calm down breathe slowly and breathe into a bag.  If needed please use the Xanax that you have prescribed as directed.  If you have recurrent palpitations and chest pain, esp if not relieved with xanax, please let me know, I will refer you to Cardiology  Please discontinue all caffeine which would be in coffee chocolate, tea  and cocoa.   Living With Anxiety  After being diagnosed with an anxiety disorder, you may be relieved to know why you have felt or behaved a certain way. It is natural to also feel overwhelmed about the treatment ahead and what it will mean for your life. With care and support, you can manage this condition and recover from it. How to cope with anxiety Dealing with stress Stress is your body's reaction to life changes and events, both good and bad. Stress can last just a few hours or it can be ongoing. Stress can play a major role in anxiety, so it is important to learn both how to cope with stress and how to think about it differently. Talk with your health care provider or a counselor to learn more about stress reduction. He or she may suggest some stress reduction techniques, such as:  Music therapy. This can include creating or listening to music that you enjoy and that inspires you.  Mindfulness-based meditation. This involves being aware of your normal breaths, rather than trying to control your breathing. It can be done while sitting or walking.  Centering prayer. This is a kind of meditation that involves focusing on a word, phrase, or sacred image  that is meaningful to you and that brings you peace.  Deep breathing. To do this, expand your stomach and inhale slowly through your nose. Hold your breath for 3-5 seconds. Then exhale slowly, allowing your stomach muscles to relax.  Self-talk. This is a skill where you identify thought patterns that lead to anxiety reactions and correct those thoughts.  Muscle relaxation. This involves tensing muscles then relaxing them. Choose a stress reduction technique that fits your lifestyle and personality. Stress reduction techniques take time and practice. Set aside 5-15 minutes a day to do them. Therapists can offer training in these techniques. The training may be covered by some insurance plans. Other things you can do to manage stress include:  Keeping a stress diary. This can help you learn what triggers your stress and ways to control your response.  Thinking about how you respond to certain situations. You may not be able to control everything, but you can control your reaction.  Making time for activities that help you relax, and not feeling guilty about spending your time in this way. Therapy combined with coping and stress-reduction skills provides the best chance for successful treatment. Medicines Medicines can help ease symptoms. Medicines for anxiety include:  Anti-anxiety drugs.  Antidepressants.  Beta-blockers. Medicines may be used as the main treatment for anxiety disorder, along with therapy, or if other treatments are not working. Medicines should be prescribed by a health care provider. Relationships  Relationships can play a big part in helping you recover. Try to spend more time connecting with trusted friends and family members. Consider going to couples counseling, taking family education classes, or going to family therapy. Therapy can help you and others better understand the condition. How to recognize changes in your condition Everyone has a different response to  treatment for anxiety. Recovery from anxiety happens when symptoms decrease and stop interfering with your daily activities at home or work. This may mean that you will start to:  Have better concentration and focus.  Sleep better.  Be less irritable.  Have more energy.  Have improved memory. It is important to recognize when your condition is getting worse. Contact your health care provider if your symptoms interfere with home or work and you do not feel like your condition is improving. Where to find help and support: You can get help and support from these sources:  Self-help groups.  Online and [employer].  A trusted spiritual leader.  Couples counseling.  Family education classes.  Family therapy. Follow these instructions at home:  Eat a healthy diet that includes plenty of vegetables, fruits, whole grains, low-fat dairy products, and lean protein. Do not eat a lot of foods that are high in solid fats, added sugars, or salt.  Exercise. Most adults should do the following: ? Exercise for at least 150 minutes each week. The exercise should increase your heart rate and make you sweat (moderate-intensity exercise). ? Strengthening exercises at least twice a week.  Cut down on caffeine, tobacco, alcohol, and other potentially harmful substances.  Get the right amount and quality of sleep. Most adults need 7-9 hours of sleep each night.  Make choices that simplify your life.  Take over-the-counter and prescription medicines only as told by your health care provider.  Avoid caffeine, alcohol, and certain over-the-counter cold medicines. These may make you feel worse. Ask your pharmacist which medicines to avoid.  Keep all follow-up visits as told by your health care provider. This is important. Questions to ask your health care provider  Would I benefit from therapy?  How often should I follow up with a health care provider?  How long do I need to take  medicine?  Are there any long-term side effects of my medicine?  Are there any alternatives to taking medicine? Contact a health care provider if:  You have a hard time staying focused or finishing daily tasks.  You spend many hours a day feeling worried about everyday life.  You become exhausted by worry.  You start to have headaches, feel tense, or have nausea.  You urinate more than normal.  You have diarrhea. Get help right away if:  You have a racing heart and shortness of breath.  You have thoughts of hurting yourself or others. If you ever feel like you may hurt yourself or others, or have thoughts about taking your own life, get help right away. You can go to your nearest emergency department or call:  Your local emergency services (911 in the [place]  A suicide crisis helpline, such as the [place] at 4377779370. This is open 24-hours a day. Summary  Taking steps to deal with stress can help calm you.  Medicines cannot cure anxiety disorders, but they can help ease symptoms.  Family, friends, and partners can play a big part in helping you recover from an anxiety disorder. This information is not intended to replace advice given to you by your  health care provider. Make sure you discuss any questions you have with your health care provider. Document Released: 08/08/2016 Document Revised: 07/27/2017 Document Reviewed: 08/08/2016 Elsevier Patient Education  2020 [employer].  Hyperventilation Hyperventilation is a condition that happens when you breathe faster and more deeply than usual. An episode of hyperventilation usually lasts 20-30 minutes. During an episode, you may feel breathless, and your hands, feet, or mouth may tingle, spasm, or feel numb. Hyperventilation is usually triggered by stress, anxiety, or emotions. However, it can also be a sign of another condition, such as:  A lung problem, including emphysema or  asthma.  An infection.  Heart problems.  Pregnancy.  Bleeding. Follow these instructions at home:   Learn and use deep breathing exercises that help you breathe from your diaphragm and abdomen.  Practice relaxation techniques to reduce stress, such as visualization, meditation, or gentle yoga.  If you are hyperventilating, breathe into a paper bag. This slows down breathing. Contact a health care provider if:  You continue to have episodes of hyperventilation.  Your hyperventilation gets worse. Get help right away if:  You pass out due to hyperventilation.  You have continued numbness after a period of hyperventilation. Summary  Hyperventilation is breathing more deeply and more rapidly than normal.  During an episode, you may feel breathless, and your hands, feet, or mouth may tingle, spasm, or feel numb.  If you are hyperventilating, try breathing into a paper bag. This slows down breathing. This information is not intended to replace advice given to you by your health care provider. Make sure you discuss any questions you have with your health care provider. Document Released: 08/11/2000 Document Revised: 01/14/2018 Document Reviewed: 01/14/2018 Elsevier Patient Education  2020 [employer].

## 2019-09-02 ENCOUNTER — Other Ambulatory Visit: Payer: Self-pay

## 2019-09-02 ENCOUNTER — Inpatient Hospital Stay (HOSPITAL_COMMUNITY): Payer: 59 | Attending: Hematology

## 2019-09-02 DIAGNOSIS — D72819 Decreased white blood cell count, unspecified: Secondary | ICD-10-CM | POA: Insufficient documentation

## 2019-09-02 DIAGNOSIS — D500 Iron deficiency anemia secondary to blood loss (chronic): Secondary | ICD-10-CM

## 2019-09-02 DIAGNOSIS — K5900 Constipation, unspecified: Secondary | ICD-10-CM | POA: Insufficient documentation

## 2019-09-02 DIAGNOSIS — Z79899 Other long term (current) drug therapy: Secondary | ICD-10-CM | POA: Insufficient documentation

## 2019-09-02 DIAGNOSIS — Z8249 Family history of ischemic heart disease and other diseases of the circulatory system: Secondary | ICD-10-CM | POA: Insufficient documentation

## 2019-09-02 LAB — COMPREHENSIVE METABOLIC PANEL
ALT: 14 U/L (ref 0–44)
AST: 16 U/L (ref 15–41)
Albumin: 3.7 g/dL (ref 3.5–5.0)
Alkaline Phosphatase: 61 U/L (ref 38–126)
Anion gap: 6 (ref 5–15)
BUN: 11 mg/dL (ref 6–20)
CO2: 27 mmol/L (ref 22–32)
Calcium: 8.8 mg/dL — ABNORMAL LOW (ref 8.9–10.3)
Chloride: 104 mmol/L (ref 98–111)
Creatinine, Ser: 0.58 mg/dL (ref 0.44–1.00)
GFR calc Af Amer: >60 mL/min (ref >60)
GFR calc non Af Amer: >60 mL/min (ref >60)
Glucose, Bld: 88 mg/dL (ref 70–99)
Potassium: 4.1 mmol/L (ref 3.5–5.1)
Sodium: 137 mmol/L (ref 135–145)
Total Bilirubin: 0.9 mg/dL (ref 0.3–1.2)
Total Protein: 7.7 g/dL (ref 6.5–8.1)

## 2019-09-02 LAB — CBC WITH DIFFERENTIAL/PLATELET
Abs Immature Granulocytes: 0.01 10*3/uL (ref 0.00–0.07)
Basophils Absolute: 0.1 10*3/uL (ref 0.0–0.1)
Basophils Relative: 2 %
Eosinophils Absolute: 0 10*3/uL (ref 0.0–0.5)
Eosinophils Relative: 1 %
HCT: 35.4 % — ABNORMAL LOW (ref 36.0–46.0)
Hemoglobin: 10.1 g/dL — ABNORMAL LOW (ref 12.0–15.0)
Immature Granulocytes: 0 %
Lymphocytes Relative: 27 %
Lymphs Abs: 1.2 10*3/uL (ref 0.7–4.0)
MCH: 19.8 pg — ABNORMAL LOW (ref 26.0–34.0)
MCHC: 28.5 g/dL — ABNORMAL LOW (ref 30.0–36.0)
MCV: 69.4 fL — ABNORMAL LOW (ref 80.0–100.0)
Monocytes Absolute: 0.4 10*3/uL (ref 0.1–1.0)
Monocytes Relative: 9 %
Neutro Abs: 2.8 10*3/uL (ref 1.7–7.7)
Neutrophils Relative %: 61 %
Platelets: 275 10*3/uL (ref 150–400)
RBC: 5.1 MIL/uL (ref 3.87–5.11)
RDW: 18 % — ABNORMAL HIGH (ref 11.5–15.5)
WBC: 4.5 10*3/uL (ref 4.0–10.5)
nRBC: 0 % (ref 0.0–0.2)

## 2019-09-02 LAB — LACTATE DEHYDROGENASE: LDH: 112 U/L (ref 98–192)

## 2019-09-02 LAB — IRON AND TIBC
Iron: 31 ug/dL (ref 28–170)
Saturation Ratios: 6 % — ABNORMAL LOW (ref 10.4–31.8)
TIBC: 500 ug/dL — ABNORMAL HIGH (ref 250–450)
UIBC: 469 ug/dL

## 2019-09-02 LAB — VITAMIN D 25 HYDROXY (VIT D DEFICIENCY, FRACTURES): Vit D, 25-Hydroxy: 21.55 ng/mL — ABNORMAL LOW (ref 30–100)

## 2019-09-02 LAB — VITAMIN B12: Vitamin B-12: 423 pg/mL (ref 180–914)

## 2019-09-02 LAB — FERRITIN: Ferritin: 4 ng/mL — ABNORMAL LOW (ref 11–307)

## 2019-09-05 LAB — METHYLMALONIC ACID, SERUM: Methylmalonic Acid, Quantitative: 74 nmol/L (ref 0–378)

## 2019-09-09 ENCOUNTER — Other Ambulatory Visit: Payer: Self-pay

## 2019-09-09 ENCOUNTER — Inpatient Hospital Stay (HOSPITAL_BASED_OUTPATIENT_CLINIC_OR_DEPARTMENT_OTHER): Payer: 59 | Admitting: Nurse Practitioner

## 2019-09-09 DIAGNOSIS — D72819 Decreased white blood cell count, unspecified: Secondary | ICD-10-CM

## 2019-09-09 DIAGNOSIS — D500 Iron deficiency anemia secondary to blood loss (chronic): Secondary | ICD-10-CM | POA: Diagnosis not present

## 2019-09-09 DIAGNOSIS — R5383 Other fatigue: Secondary | ICD-10-CM | POA: Diagnosis not present

## 2019-09-09 NOTE — Assessment & Plan Note (Addendum)
1.  Iron deficiency anemia: - Likely due to menorrhagia. - She is currently taking oral iron supplements.  She reports it does cause mild constipation however she is managing this with over-the-counter stool softeners. -She has been seen by her GYN help control her heavy cycles. -She has been seen by GI.  Last EGD and colonoscopy was done on 09/27/2017 by Dr. Laural Golden and it showed normal esophagus, and external hemorrhoids. -Patient had an evaluation of her blood work done including ferritin, LDH, protein electrophoresis, hemoglobinopathy evaluation, vitamin B12, folate, methylmalonic acid, haptoglobin, SPEP. -SPEP was negative. - She denies any blood in her stool or urine. - Labs on 09/02/2019 showed her hemoglobin at 10.1, platelets 275, WBC 4.5, and ferritin 4, percent saturation 6, creatinine is 0.58.  Vitamin B12 was 423. -She is still taking oral vitamin B 12.  She reports she has not been taking her iron tablets every day.  She will start these back she does not want to do IV iron at this time. -She will continue taking her oral iron tablets. - We will follow-up with her in 4 months to repeat labs.  2.  Leukopenia: - Likely a normal variant. - WBC initially was 2.9 with a normal differential. -An evaluation of her blood work was done including hepatitis B core antibody, total, hepatitis B surface antibody, qualitative, hepatitis B surface antigen, hepatitis C RNA quantitative, hepatitis panel, acute, HIV antibody, ANA, IFA, and SPEP.  All were negative. - Labs on 09/02/2019 showed her WBC 4.5 -She denies any recurrent infections. -We will continue to monitor.

## 2019-09-09 NOTE — Progress Notes (Signed)
[facility] [place], [place] [address]   CLINIC:  Medical Oncology/Hematology  PCP:  Fayrene Helper, MD [address], [place]  [address] [phone]   REASON FOR VISIT: Follow-up for iron deficiency anemia  CURRENT THERAPY: Oral iron tablets    INTERVAL HISTORY:  Dawn [name] 54 y.o. female is called for a telephone visit to follow-up for iron deficiency anemia.  She reports she is not taking her iron tablets every day as prescribed.  Denies any bright red bleeding per rectum or melena. Denies any nausea, vomiting, or diarrhea. Denies any new pains. Had not noticed any recent bleeding such as epistaxis, hematuria or hematochezia. Denies recent chest pain on exertion, shortness of breath on minimal exertion, pre-syncopal episodes, or palpitations. Denies any numbness or tingling in hands or feet. Denies any recent fevers, infections, or recent hospitalizations. Patient reports appetite at 100% and energy level at 50%.  She is eating well maintain her weight this time.   REVIEW OF SYSTEMS:  Review of Systems  Constitutional: Positive for fatigue.  All other systems reviewed and are negative.    PAST MEDICAL/SURGICAL HISTORY:  Past Medical History:  Diagnosis Date  . Anemia   . Anxiety 05/21/2017  . At risk for infectious disease due to recent foreign travel 07/28/2018   pt traveled to [place]  . Burning with urination 08/17/2014  . BV (bacterial vaginosis) 01/21/2014  . Fibroid 02/09/2017  . Lyme disease   . [facility] spotted fever   . Vaginal discharge 01/21/2014  . Vaginal itching 11/03/2015   Past Surgical History:  Procedure Laterality Date  . CESAREAN SECTION    . COLONOSCOPY N/A 09/27/2017   Procedure: COLONOSCOPY;  Surgeon: Rogene Houston, MD;  Location[facility];  Service: Endoscopy;  Laterality: N/A;  . ESOPHAGOGASTRODUODENOSCOPY N/A 09/27/2017   Procedure: ESOPHAGOGASTRODUODENOSCOPY (EGD);  Surgeon: Rogene Houston, MD;  Location: [facility];  Service: Endoscopy;  Laterality: N/A;  100-rescheduled to 09/27/17 @ 8:30am per Lelon Frohlich   . TUBAL LIGATION       SOCIAL HISTORY:  Social History   Socioeconomic History  . Marital status: Widowed    Spouse name: Not on file  . Number of children: 2  . Years of education: Not on file  . Highest education level: Not on file  Occupational History  . Occupation: Theatre manager    Comment: recently retired  Tobacco Use  . Smoking status: Never Smoker  . Smokeless tobacco: Never Used  Substance and Sexual Activity  . Alcohol use: No  . Drug use: No  . Sexual activity: Not Currently    Birth control/protection: Surgical    Comment: tubal  Other Topics Concern  . Not on file  Social History Narrative  . Not on file   Social Determinants of Health   Financial Resource Strain: Low Risk   . Difficulty of Paying Living Expenses: Not hard at all  Food Insecurity: No Food Insecurity  . Worried About Charity fundraiser in the Last Year: Never true  . Ran Out of Food in the Last Year: Never true  Transportation Needs: No Transportation Needs  . Lack of Transportation (Medical): No  . Lack of Transportation (Non-Medical): No  Physical Activity: Insufficiently Active  . Days of Exercise per Week: 2 days  . Minutes of Exercise per Session: 30 min  Stress: No Stress Concern Present  . Feeling of Stress : Not at all  Social  Connections: Slightly Isolated  . Frequency of Communication with Friends and Family: Three times a week  . Frequency of Social Gatherings with Friends and Family: Three times a week  . Attends Religious Services: More than 4 times per year  . Active Member of Clubs or Organizations: Yes  . Attends Archivist Meetings: 1 to 4 times per year  . Marital Status: Widowed  Intimate Partner Violence: Not At Risk  . Fear of Current or Ex-Partner: No  . Emotionally Abused: No  . Physically Abused: No  . Sexually Abused: No     FAMILY HISTORY:  Family History  Problem Relation Age of Onset  . Hypertension Mother   . Hypertension Father   . Cancer Father   . Alzheimer's disease Maternal Grandmother   . Cancer Maternal Grandfather        colon  . Colon cancer Maternal Grandfather   . Cancer Paternal Grandfather        colon  . Colon cancer Paternal Grandfather     CURRENT MEDICATIONS:  Outpatient Encounter Medications as of 09/09/2019  Medication Sig Note  . ALPRAZolam (XANAX) 0.5 MG tablet Take 1 tablet (0.5 mg total) by mouth 2 (two) times daily as needed for anxiety. (Patient not taking: Reported on 07/30/2019) 07/30/2019: Hasn't taken  . Cyanocobalamin (B-12) 1000 MCG TABS Take 1 tablet by mouth daily.   Marland Kitchen docusate sodium (COLACE) 50 MG capsule Take 1 capsule (50 mg total) by mouth 2 (two) times daily. (Patient not taking: Reported on 07/21/2019)   . ferrous sulfate 325 (65 FE) MG tablet Take 325 mg by mouth daily with breakfast.     No facility-administered encounter medications on file as of 09/09/2019.    ALLERGIES:  No Known Allergies  Vital signs: -Deferred due to telephone visit  Physical Exam -Deferred due to telephone visit -Patient was alert and oriented over the phone and in no acute distress.  LABORATORY DATA:  I have reviewed the labs as listed.  CBC    Component Value Date/Time   WBC 4.5 09/02/2019 1110   RBC 5.10 09/02/2019 1110   HGB 10.1 (L) 09/02/2019 1110   HCT 35.4 (L) 09/02/2019 1110   PLT 275 09/02/2019 1110   MCV 69.4 (L) 09/02/2019 1110   MCH 19.8 (L) 09/02/2019 1110   MCHC 28.5 (L) 09/02/2019 1110   RDW 18.0 (H) 09/02/2019 1110   LYMPHSABS 1.2 09/02/2019 1110   MONOABS 0.4 09/02/2019 1110   EOSABS 0.0 09/02/2019 1110   BASOSABS 0.1 09/02/2019 1110   CMP Latest Ref Rng & Units 09/02/2019 07/23/2019 07/21/2019  Glucose 70 - 99 mg/dL 88 100(H) 104(H)  BUN 6 - 20 mg/dL 11 10 <5(L)  Creatinine 0.44 - 1.00 mg/dL 0.58 0.69 0.58  Sodium 135 - 145 mmol/L 137 136  138  Potassium 3.5 - 5.1 mmol/L 4.1 3.9 3.2(L)  Chloride 98 - 111 mmol/L 104 102 105  CO2 22 - 32 mmol/L 27 22 23   Calcium 8.9 - 10.3 mg/dL 8.8(L) 9.5 9.5  Total Protein 6.5 - 8.1 g/dL 7.7 - 8.7(H)  Total Bilirubin 0.3 - 1.2 mg/dL 0.9 - 1.2  Alkaline Phos 38 - 126 U/L 61 - 45  AST 15 - 41 U/L 16 - 17  ALT 0 - 44 U/L 14 - 15     All questions were answered to patient's stated satisfaction. Encouraged patient to call with any new concerns or questions before his next visit to the cancer center and we can certain  see him sooner, if needed.      ASSESSMENT & PLAN:   Iron deficiency anemia due to chronic blood loss 1.  Iron deficiency anemia: - Likely due to menorrhagia. - She is currently taking oral iron supplements.  She reports it does cause mild constipation however she is managing this with over-the-counter stool softeners. -She has been seen by her GYN help control her heavy cycles. -She has been seen by GI.  Last EGD and colonoscopy was done on 09/27/2017 by Dr. Laural Golden and it showed normal esophagus, and external hemorrhoids. -Patient had an evaluation of her blood work done including ferritin, LDH, protein electrophoresis, hemoglobinopathy evaluation, vitamin B12, folate, methylmalonic acid, haptoglobin, SPEP. -SPEP was negative. - She denies any blood in her stool or urine. - Labs on 09/02/2019 showed her hemoglobin at 10.1, platelets 275, WBC 4.5, and ferritin 4, percent saturation 6, creatinine is 0.58.  Vitamin B12 was 423. -She is still taking oral vitamin B 12.  She reports she has not been taking her iron tablets every day.  She will start these back she does not want to do IV iron at this time. -She will continue taking her oral iron tablets. - We will follow-up with her in 4 months to repeat labs.  2.  Leukopenia: - Likely a normal variant. - WBC initially was 2.9 with a normal differential. -An evaluation of her blood work was done including hepatitis B core antibody,  total, hepatitis B surface antibody, qualitative, hepatitis B surface antigen, hepatitis C RNA quantitative, hepatitis panel, acute, HIV antibody, ANA, IFA, and SPEP.  All were negative. - Labs on 09/02/2019 showed her WBC 4.5 -She denies any recurrent infections. -We will continue to monitor.       Orders placed this encounter:  Orders Placed This Encounter  Procedures  . Lactate dehydrogenase  . CBC with Differential/Platelet  . Comprehensive metabolic panel  . Ferritin  . Iron and TIBC  . Vitamin D 25 hydroxy  . Vitamin B12  . Folate      Dawn Finders, FNP-C [place] [phone]

## 2019-09-10 ENCOUNTER — Ambulatory Visit (HOSPITAL_COMMUNITY)
Admission: RE | Admit: 2019-09-10 | Discharge: 2019-09-10 | Disposition: A | Discharge status: Home / Self Care | Payer: 59 | Source: Ambulatory Visit | Attending: Family Medicine | Admitting: Family Medicine

## 2019-09-10 DIAGNOSIS — Z1231 Encounter for screening mammogram for malignant neoplasm of breast: Secondary | ICD-10-CM | POA: Insufficient documentation

## 2019-11-06 ENCOUNTER — Ambulatory Visit: Payer: Self-pay | Attending: Internal Medicine

## 2019-11-06 DIAGNOSIS — Z23 Encounter for immunization: Secondary | ICD-10-CM

## 2019-11-06 NOTE — Progress Notes (Signed)
   Covid-19 Vaccination Clinic  Name:  Dawn [name]    MRN: [id number] DOB: 1966-08-12  11/06/2019  [name] was observed post Covid-19 immunization for 15 minutes without incident. She was provided with Vaccine Information Sheet and instruction to access the V-Safe system.   [name] was instructed to call 911 with any severe reactions post vaccine: Marland Kitchen Difficulty breathing  . Swelling of face and throat  . A fast heartbeat  . A bad rash all over body  . Dizziness and weakness   Immunizations Administered    Name Date Dose VIS Date Route   Moderna COVID-19 Vaccine 11/06/2019 10:44 AM 0.5 mL 07/29/2019 Intramuscular   Manufacturer: Moderna   Lot: [id number]   [id number]

## 2019-12-09 ENCOUNTER — Ambulatory Visit: Payer: Self-pay | Attending: Internal Medicine

## 2019-12-09 DIAGNOSIS — Z23 Encounter for immunization: Secondary | ICD-10-CM

## 2019-12-09 NOTE — Progress Notes (Signed)
   Covid-19 Vaccination Clinic  Name:  Dawn [name]    MRN: [id number] DOB: [birth year]  12/09/2019  Dawn [name] was observed post Covid-19 immunization for 15 minutes without incident. She was provided with Vaccine Information Sheet and instruction to access the V-Safe system.   Dawn [name] was instructed to call 911 with any severe reactions post vaccine: Marland Kitchen Difficulty breathing  . Swelling of face and throat  . A fast heartbeat  . A bad rash all over body  . Dizziness and weakness   Immunizations Administered    Name Date Dose VIS Date Route   Moderna COVID-19 Vaccine 12/09/2019  9:38 AM 0.5 mL 07/29/2019 Intramuscular   Manufacturer: Moderna   Lot: [id number]   [place] [id number]

## 2019-12-29 ENCOUNTER — Ambulatory Visit: Payer: 59 | Admitting: Family Medicine

## 2020-01-06 ENCOUNTER — Inpatient Hospital Stay (HOSPITAL_COMMUNITY): Payer: 59 | Attending: Hematology

## 2020-01-06 DIAGNOSIS — K5900 Constipation, unspecified: Secondary | ICD-10-CM | POA: Insufficient documentation

## 2020-01-06 DIAGNOSIS — D72819 Decreased white blood cell count, unspecified: Secondary | ICD-10-CM | POA: Diagnosis not present

## 2020-01-06 DIAGNOSIS — Z8249 Family history of ischemic heart disease and other diseases of the circulatory system: Secondary | ICD-10-CM | POA: Diagnosis not present

## 2020-01-06 DIAGNOSIS — Z79899 Other long term (current) drug therapy: Secondary | ICD-10-CM | POA: Diagnosis not present

## 2020-01-06 DIAGNOSIS — D500 Iron deficiency anemia secondary to blood loss (chronic): Secondary | ICD-10-CM | POA: Diagnosis present

## 2020-01-06 DIAGNOSIS — N920 Excessive and frequent menstruation with regular cycle: Secondary | ICD-10-CM | POA: Diagnosis present

## 2020-01-06 LAB — CBC WITH DIFFERENTIAL/PLATELET
Abs Immature Granulocytes: 0.01 10*3/uL (ref 0.00–0.07)
Basophils Absolute: 0.1 10*3/uL (ref 0.0–0.1)
Basophils Relative: 2 %
Eosinophils Absolute: 0.1 10*3/uL (ref 0.0–0.5)
Eosinophils Relative: 2 %
HCT: 39.2 % (ref 36.0–46.0)
Hemoglobin: 11.3 g/dL — ABNORMAL LOW (ref 12.0–15.0)
Immature Granulocytes: 0 %
Lymphocytes Relative: 36 %
Lymphs Abs: 1.5 10*3/uL (ref 0.7–4.0)
MCH: 20.8 pg — ABNORMAL LOW (ref 26.0–34.0)
MCHC: 28.8 g/dL — ABNORMAL LOW (ref 30.0–36.0)
MCV: 72.3 fL — ABNORMAL LOW (ref 80.0–100.0)
Monocytes Absolute: 0.5 10*3/uL (ref 0.1–1.0)
Monocytes Relative: 12 %
Neutro Abs: 2 10*3/uL (ref 1.7–7.7)
Neutrophils Relative %: 48 %
Platelets: 259 10*3/uL (ref 150–400)
RBC: 5.42 MIL/uL — ABNORMAL HIGH (ref 3.87–5.11)
RDW: 16.9 % — ABNORMAL HIGH (ref 11.5–15.5)
WBC: 4.1 10*3/uL (ref 4.0–10.5)
nRBC: 0 % (ref 0.0–0.2)

## 2020-01-06 LAB — FOLATE: Folate: 7.1 ng/mL (ref >5.9)

## 2020-01-06 LAB — IRON AND TIBC
Iron: 53 ug/dL (ref 28–170)
Saturation Ratios: 12 % (ref 10.4–31.8)
TIBC: 440 ug/dL (ref 250–450)
UIBC: 387 ug/dL

## 2020-01-06 LAB — COMPREHENSIVE METABOLIC PANEL
ALT: 15 U/L (ref 0–44)
AST: 17 U/L (ref 15–41)
Albumin: 3.9 g/dL (ref 3.5–5.0)
Alkaline Phosphatase: 70 U/L (ref 38–126)
Anion gap: 8 (ref 5–15)
BUN: 17 mg/dL (ref 6–20)
CO2: 28 mmol/L (ref 22–32)
Calcium: 8.9 mg/dL (ref 8.9–10.3)
Chloride: 102 mmol/L (ref 98–111)
Creatinine, Ser: 0.54 mg/dL (ref 0.44–1.00)
GFR calc Af Amer: >60 mL/min (ref >60)
GFR calc non Af Amer: >60 mL/min (ref >60)
Glucose, Bld: 99 mg/dL (ref 70–99)
Potassium: 4.6 mmol/L (ref 3.5–5.1)
Sodium: 138 mmol/L (ref 135–145)
Total Bilirubin: 0.7 mg/dL (ref 0.3–1.2)
Total Protein: 7.8 g/dL (ref 6.5–8.1)

## 2020-01-06 LAB — VITAMIN B12: Vitamin B-12: 334 pg/mL (ref 180–914)

## 2020-01-06 LAB — LACTATE DEHYDROGENASE: LDH: 109 U/L (ref 98–192)

## 2020-01-06 LAB — FERRITIN: Ferritin: 6 ng/mL — ABNORMAL LOW (ref 11–307)

## 2020-01-07 LAB — VITAMIN D 25 HYDROXY (VIT D DEFICIENCY, FRACTURES): Vit D, 25-Hydroxy: 22.4 ng/mL — ABNORMAL LOW (ref 30–100)

## 2020-01-13 ENCOUNTER — Inpatient Hospital Stay (HOSPITAL_BASED_OUTPATIENT_CLINIC_OR_DEPARTMENT_OTHER): Payer: 59 | Admitting: Nurse Practitioner

## 2020-01-13 DIAGNOSIS — D500 Iron deficiency anemia secondary to blood loss (chronic): Secondary | ICD-10-CM

## 2020-01-13 MED ORDER — ERGOCALCIFEROL 1.25 MG (50000 UT) PO CAPS
50000.0000 [IU] | ORAL_CAPSULE | ORAL | 3 refills | Status: DC
Start: 2020-01-13 — End: 2020-10-07

## 2020-01-13 NOTE — Progress Notes (Signed)
[facility] Follow up:    Dawn Helper, MD [address], [address] [place] [address]   DIAGNOSIS: Iron deficiency anemia  CURRENT THERAPY: Oral iron therapy INTERVAL HISTORY: Dawn [name] 54 y.o. female returns for iron deficiency anemia.  Patient reports she is doing well since her last visit.  She denies any bright red bleeding per rectum or melena.  She denies any easy bruising or bleeding.  She denies any new signs or symptoms. Denies any nausea, vomiting, or diarrhea. Denies any new pains. Had not noticed any recent bleeding such as epistaxis, hematuria or hematochezia. Denies recent chest pain on exertion, shortness of breath on minimal exertion, pre-syncopal episodes, or palpitations. Denies any numbness or tingling in hands or feet. Denies any recent fevers, infections, or recent hospitalizations. Patient reports appetite at 100% and energy level at 75%.  She is eating well maintain her weight at this time.    Patient Active Problem List   Diagnosis Date Noted  . Panic attack 07/30/2019  . 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 Vaginitis 03/20/2019  . Hemorrhoids 03/20/2019  . Immunization refused 10/07/2018  . At risk for infectious disease due to recent foreign travel 05/21/2017  . Exposure to parasitic disease 04/30/2017  . Absolute anemia 03/28/2017  . Positive Lyme disease serology 03/22/2017  . [facility] spotted fever) 03/22/2017  . Fibroid 02/09/2017  . Enlarged uterus 01/30/2017  . Iron deficiency anemia due to chronic blood loss 01/30/2017  . Menorrhagia with regular cycle 01/30/2017  . 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 Vitamin D deficiency 12/28/2008    has No Known Allergies.  MEDICAL HISTORY: Past Medical History:  Diagnosis Date  . Anemia   . Anxiety 05/21/2017  . At risk for infectious disease due to recent foreign travel 07/28/2018   pt traveled to [place]  . Burning with urination 08/17/2014  . BV  (bacterial vaginosis) 01/21/2014  . Fibroid 02/09/2017  . Lyme disease   . [facility] spotted fever   . Vaginal discharge 01/21/2014  . Vaginal itching 11/03/2015    SURGICAL HISTORY: Past Surgical History:  Procedure Laterality Date  . CESAREAN SECTION    . COLONOSCOPY N/A 09/27/2017   Procedure: COLONOSCOPY;  Surgeon: Rogene Houston, MD;  [facility];  Service: Endoscopy;  Laterality: N/A;  . ESOPHAGOGASTRODUODENOSCOPY N/A 09/27/2017   Procedure: ESOPHAGOGASTRODUODENOSCOPY (EGD);  Surgeon: Rogene Houston, MD;  Location[facility];  Service: Endoscopy;  Laterality: N/A;  100-rescheduled to 09/27/17 @ 8:30am per Lelon Frohlich   . TUBAL LIGATION      SOCIAL HISTORY: Social History   Socioeconomic History  . Marital status: Widowed    Spouse name: Not on file  . Number of children: 2  . Years of education: Not on file  . Highest education level: Not on file  Occupational History  . Occupation: Theatre manager    Comment: recently retired  Tobacco Use  . Smoking status: Never Smoker  . Smokeless tobacco: Never Used  Substance and Sexual Activity  . Alcohol use: No  . Drug use: No  . Sexual activity: Not Currently    Birth control/protection: Surgical    Comment: tubal  Other Topics Concern  . Not on file  Social History Narrative  . Not on file   Social Determinants of Health   Financial Resource Strain:   . Difficulty of Paying Living Expenses:   Food Insecurity:   . Worried About Estate manager/land agent  of Food in the Last Year:   . [place] in the Last Year:   Transportation Needs:   . Lack of Transportation (Medical):   Marland Kitchen Lack of Transportation (Non-Medical):   Physical Activity:   . Days of Exercise per Week:   . Minutes of Exercise per Session:   Stress:   . Feeling of Stress :   Social Connections:   . Frequency of Communication with Friends and Family:   . Frequency of Social Gatherings with Friends and Family:   . Attends Religious Services:   .  Active Member of Clubs or Organizations:   . Attends Archivist Meetings:   Marland Kitchen Marital Status:   Intimate Partner Violence:   . Fear of Current or Ex-Partner:   . Emotionally Abused:   Marland Kitchen Physically Abused:   . Sexually Abused:     FAMILY HISTORY: Family History  Problem Relation Age of Onset  . Hypertension Mother   . Hypertension Father   . Cancer Father   . Alzheimer's disease Maternal Grandmother   . Cancer Maternal Grandfather        colon  . Colon cancer Maternal Grandfather   . Cancer Paternal Grandfather        colon  . Colon cancer Paternal Grandfather    Review of symptoms She denies any signs or symptoms at this time.  Vital signs: Deferred due to telephone visit  Physical Exam -Deferred due to telephone visit -Patient was alert and oriented over the phone and in no acute distress.  LABORATORY DATA:  CBC    Component Value Date/Time   WBC 4.1 01/06/2020 0859   RBC 5.42 (H) 01/06/2020 0859   HGB 11.3 (L) 01/06/2020 0859   HCT 39.2 01/06/2020 0859   PLT 259 01/06/2020 0859   MCV 72.3 (L) 01/06/2020 0859   MCH 20.8 (L) 01/06/2020 0859   MCHC 28.8 (L) 01/06/2020 0859   RDW 16.9 (H) 01/06/2020 0859   LYMPHSABS 1.5 01/06/2020 0859   MONOABS 0.5 01/06/2020 0859   EOSABS 0.1 01/06/2020 0859   BASOSABS 0.1 01/06/2020 0859    CMP     Component Value Date/Time   NA 138 01/06/2020 0859   K 4.6 01/06/2020 0859   CL 102 01/06/2020 0859   CO2 28 01/06/2020 0859   GLUCOSE 99 01/06/2020 0859   BUN 17 01/06/2020 0859   CREATININE 0.54 01/06/2020 0859   CREATININE 0.69 10/07/2018 0850   CALCIUM 8.9 01/06/2020 0859   PROT 7.8 01/06/2020 0859   ALBUMIN 3.9 01/06/2020 0859   AST 17 01/06/2020 0859   ALT 15 01/06/2020 0859   ALKPHOS 70 01/06/2020 0859   BILITOT 0.7 01/06/2020 0859   GFRNONAA >60 01/06/2020 0859   GFRNONAA 101 10/07/2018 0850   GFRAA >60 01/06/2020 0859   GFRAA 117 10/07/2018 0850   All questions were answered to patient's  stated satisfaction. Encouraged patient to call with any new concerns or questions before his next visit to the cancer center and we can certain see him sooner, if needed.     ASSESSMENT and THERAPY PLAN:   Iron deficiency anemia due to chronic blood loss 1.  Iron deficiency anemia: - Likely due to menorrhagia. - She is currently taking oral iron supplements.  She reports it does cause mild constipation however she is managing this with over-the-counter stool softeners. -She has been seen by her GYN help control her heavy cycles. -She has been seen by GI.  Last EGD and colonoscopy  was done on 09/27/2017 by Dr. Laural Golden and it showed normal esophagus, and external hemorrhoids. -Patient had an evaluation of her blood work done including ferritin, LDH, protein electrophoresis, hemoglobinopathy evaluation, vitamin B12, folate, methylmalonic acid, haptoglobin, SPEP. -SPEP was negative. - She denies any blood in her stool or urine. - Labs on 01/06/2020 showed her hemoglobin at 11.3, platelets 259, and ferritin 6, percent saturation 12.  Vitamin B12 was 334 -She is still taking oral vitamin B 12.  She reports she has not been taking her iron tablets every day she has only been taking it a few times a week.Marland Kitchen  She will start these back she does not want to do IV iron at this time. -She will continue taking her oral iron tablets. - We will follow-up with her in 4 months to repeat labs.  2.  Leukopenia: - Likely a normal variant. - WBC initially was 2.9 with a normal differential. -An evaluation of her blood work was done including hepatitis B core antibody, total, hepatitis B surface antibody, qualitative, hepatitis B surface antigen, hepatitis C RNA quantitative, hepatitis panel, acute, HIV antibody, ANA, IFA, and SPEP.  All were negative. - Labs on 01/06/2020 showed her WBC 4.1 -She denies any recurrent infections. -We will continue to monitor.    No orders of the defined types were placed in this  encounter.   All questions were answered. The patient knows to call the clinic with any problems, questions or concerns. We can certainly see the patient much sooner if necessary. This note was electronically signed.  I provided 30 minutes of non face-to-face telephone visit time during this encounter, and > 50% was spent counseling as documented under my assessment & plan.   Glennie [place], NP-C 01/13/2020

## 2020-01-13 NOTE — Assessment & Plan Note (Signed)
1.  Iron deficiency anemia: - Likely due to menorrhagia. - She is currently taking oral iron supplements.  She reports it does cause mild constipation however she is managing this with over-the-counter stool softeners. -She has been seen by her GYN help control her heavy cycles. -She has been seen by GI.  Last EGD and colonoscopy was done on 09/27/2017 by Dr. Laural Golden and it showed normal esophagus, and external hemorrhoids. -Patient had an evaluation of her blood work done including ferritin, LDH, protein electrophoresis, hemoglobinopathy evaluation, vitamin B12, folate, methylmalonic acid, haptoglobin, SPEP. -SPEP was negative. - She denies any blood in her stool or urine. - Labs on 01/06/2020 showed her hemoglobin at 11.3, platelets 259, and ferritin 6, percent saturation 12.  Vitamin B12 was 334 -She is still taking oral vitamin B 12.  She reports she has not been taking her iron tablets every day she has only been taking it a few times a week.Marland Kitchen  She will start these back she does not want to do IV iron at this time. -She will continue taking her oral iron tablets. - We will follow-up with her in 4 months to repeat labs.  2.  Leukopenia: - Likely a normal variant. - WBC initially was 2.9 with a normal differential. -An evaluation of her blood work was done including hepatitis B core antibody, total, hepatitis B surface antibody, qualitative, hepatitis B surface antigen, hepatitis C RNA quantitative, hepatitis panel, acute, HIV antibody, ANA, IFA, and SPEP.  All were negative. - Labs on 01/06/2020 showed her WBC 4.1 -She denies any recurrent infections. -We will continue to monitor.

## 2020-02-22 IMAGING — DX DG ABDOMEN ACUTE W/ 1V CHEST
3 series · 3 of 3 positions shown · non-contrast
Comparison: None.

CLINICAL DATA: Diarrhea and shortness of breath

EXAM:
DG ABDOMEN ACUTE W/ 1V CHEST

[abdomen supine]
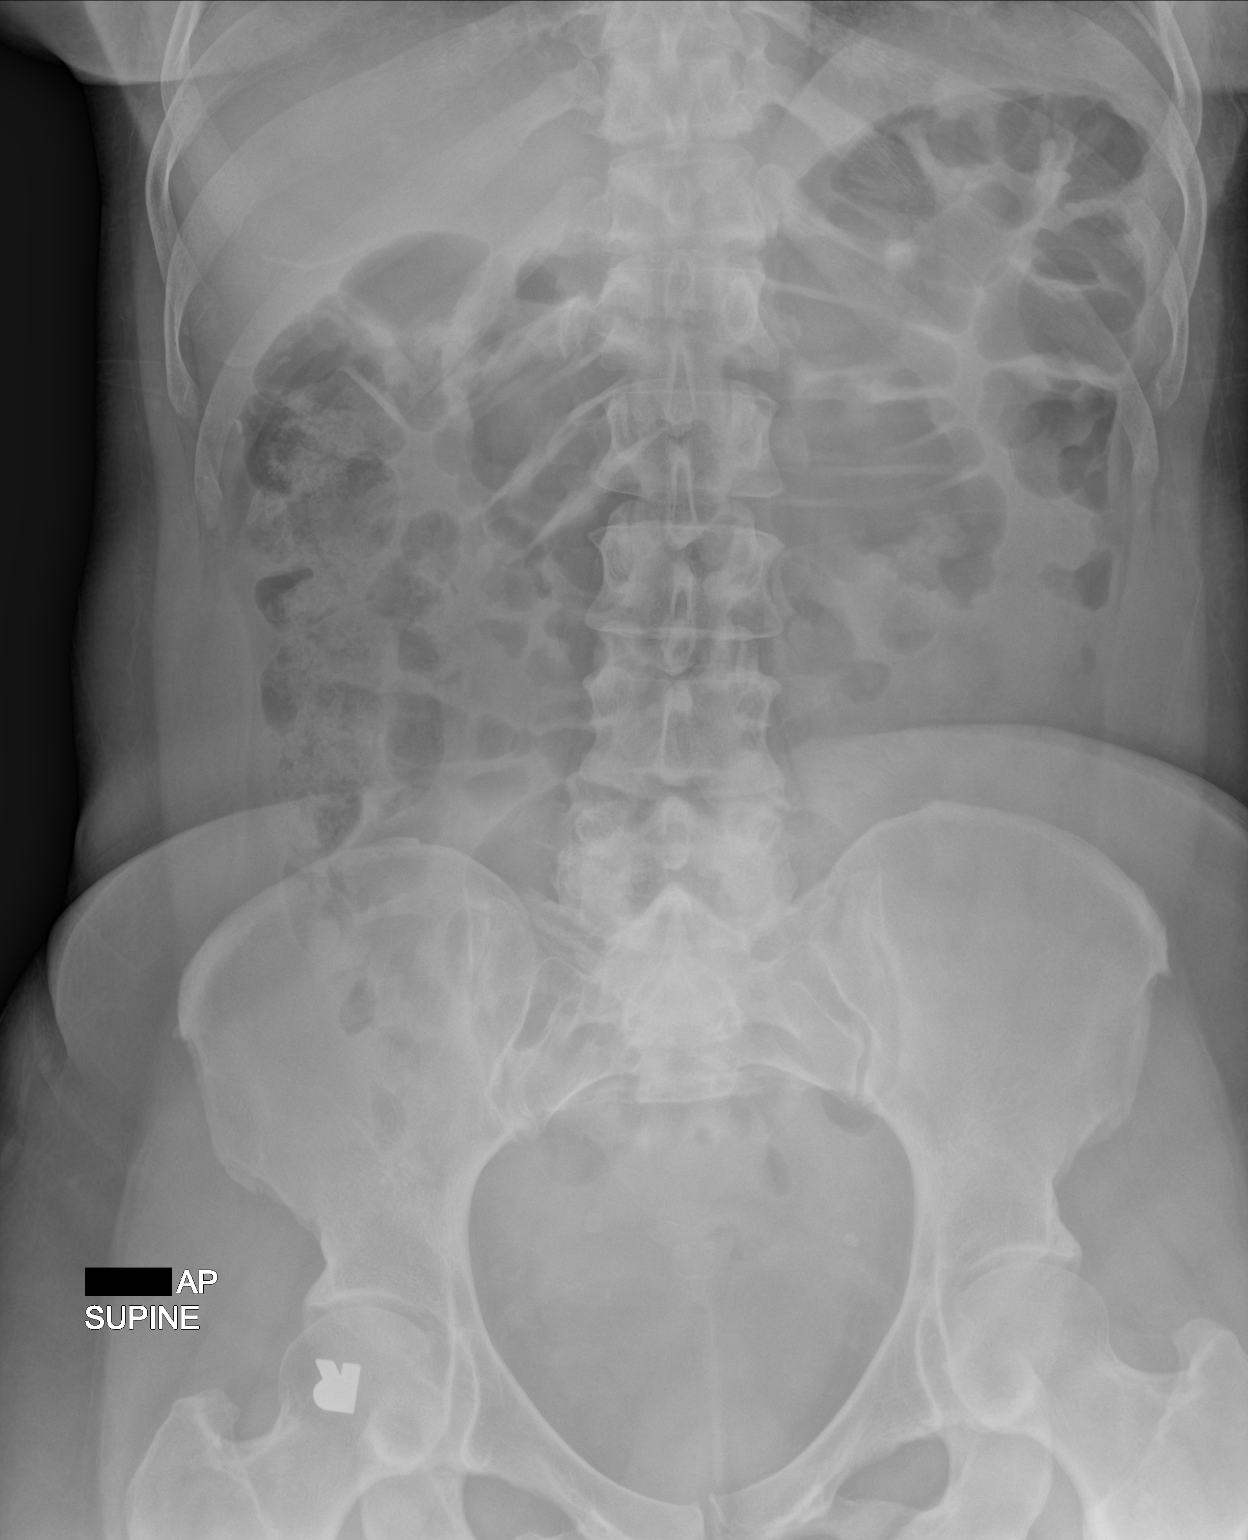

[chest ap]
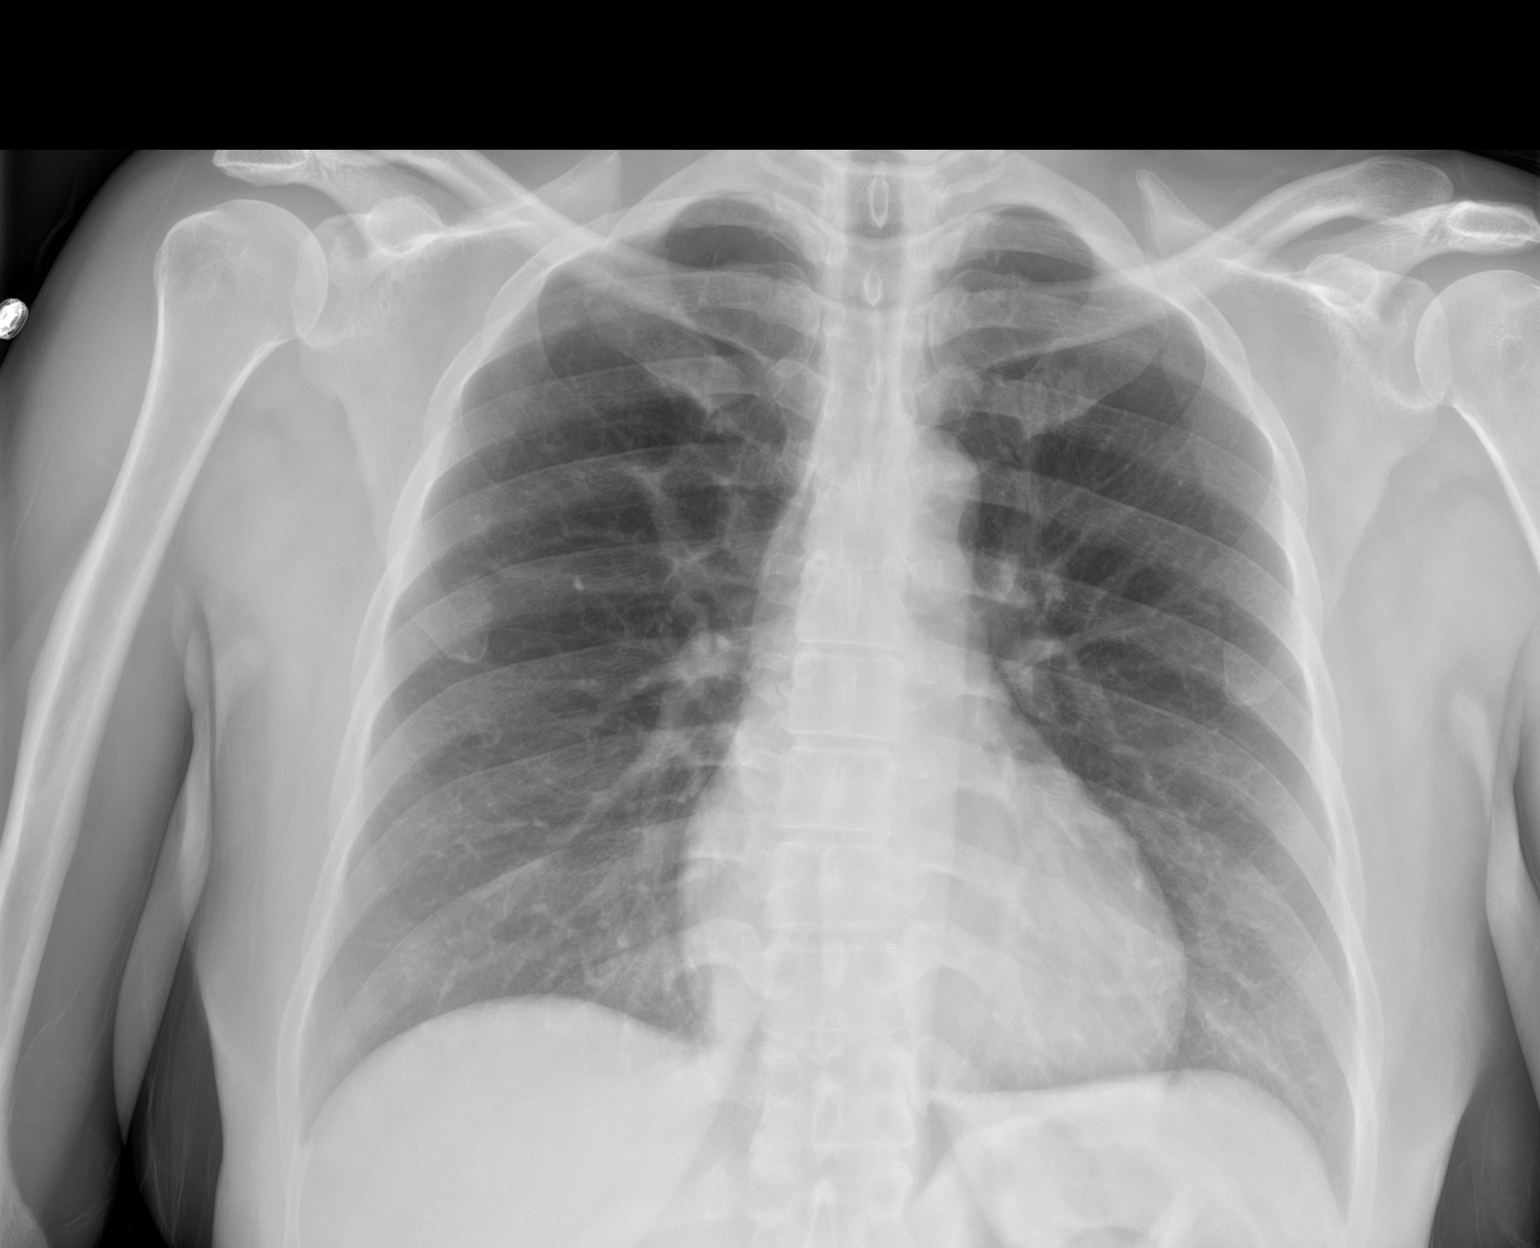

[abdomen erect]
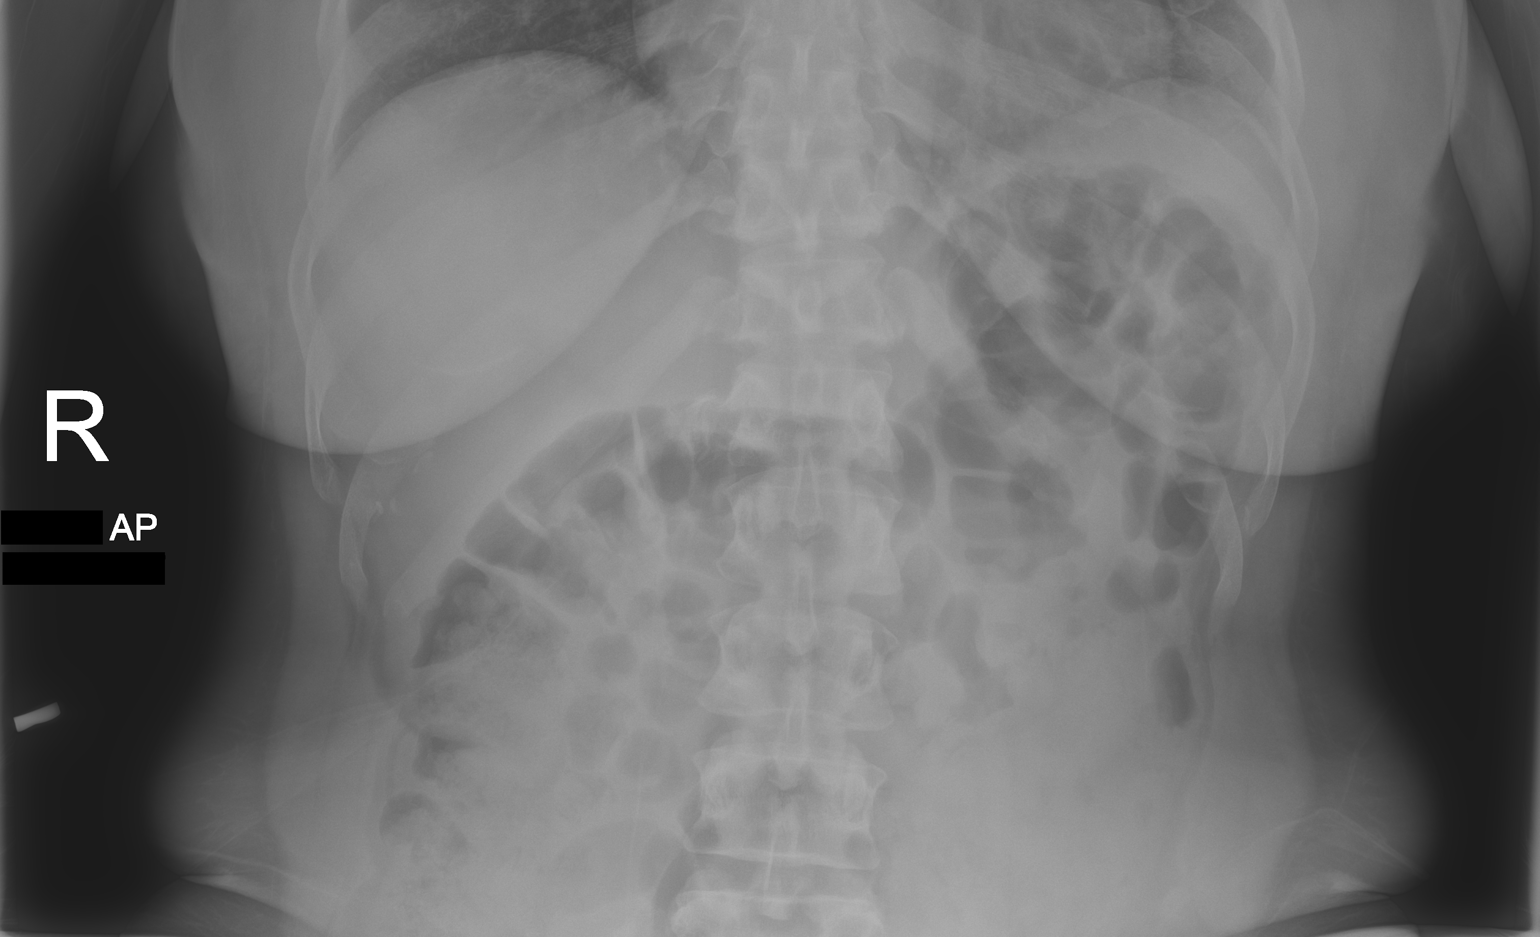

[3 of 3 positions shown; findings below may reference images not displayed]

FINDINGS: Cardiac shadow is within normal limits. Lungs are well aerated
bilaterally without focal infiltrate or sizable effusion.

Scattered large and small bowel gas is noted. No abnormal mass or
abnormal calcifications are seen. No acute bony abnormality is
noted.
IMPRESSION: No acute abnormality noted.

## 2020-04-07 ENCOUNTER — Ambulatory Visit (HOSPITAL_COMMUNITY)
Admission: RE | Admit: 2020-04-07 | Discharge: 2020-04-07 | Disposition: A | Discharge status: Home / Self Care | Payer: 59 | Source: Ambulatory Visit | Attending: Family Medicine | Admitting: Family Medicine

## 2020-04-07 ENCOUNTER — Ambulatory Visit (INDEPENDENT_AMBULATORY_CARE_PROVIDER_SITE_OTHER): Payer: Self-pay | Admitting: Family Medicine

## 2020-04-07 ENCOUNTER — Encounter: Payer: Self-pay | Admitting: Family Medicine

## 2020-04-07 ENCOUNTER — Other Ambulatory Visit: Payer: Self-pay

## 2020-04-07 VITALS — BP 102/67 | HR 78 | Resp 16 | Ht 65.0 in | Wt 168.1 lb

## 2020-04-07 DIAGNOSIS — M545 Low back pain, unspecified: Secondary | ICD-10-CM

## 2020-04-07 DIAGNOSIS — Z1231 Encounter for screening mammogram for malignant neoplasm of breast: Secondary | ICD-10-CM

## 2020-04-07 DIAGNOSIS — M546 Pain in thoracic spine: Secondary | ICD-10-CM

## 2020-04-07 DIAGNOSIS — Z01419 Encounter for gynecological examination (general) (routine) without abnormal findings: Secondary | ICD-10-CM

## 2020-04-07 DIAGNOSIS — R7989 Other specified abnormal findings of blood chemistry: Secondary | ICD-10-CM

## 2020-04-07 DIAGNOSIS — D500 Iron deficiency anemia secondary to blood loss (chronic): Secondary | ICD-10-CM

## 2020-04-07 DIAGNOSIS — E559 Vitamin D deficiency, unspecified: Secondary | ICD-10-CM

## 2020-04-07 DIAGNOSIS — Z13220 Encounter for screening for lipoid disorders: Secondary | ICD-10-CM

## 2020-04-07 MED ORDER — CYCLOBENZAPRINE HCL 10 MG PO TABS: ORAL_TABLET | ORAL | 0 refills | Status: DC

## 2020-04-07 NOTE — Progress Notes (Signed)
   Dawn [name]     MRN: [id number]      DOB: 1966-08-25   HPI Dawn [name] is here for follow up and re-evaluation of chronic medical conditions, medication management and review of any available recent lab and radiology data.  Preventive health is updated, specifically  Cancer screening and Immunization.   The PT denies any adverse reactions to current medications since the last visit. Took one xanax for panic attack in past 6 months 2 weeks ago pt had acure posterior left chest pain, daily , rated up to a 7, aggravated by pressure and movement, tylenol relieved , last taken 1 week ago No regular exercise and increased food intak wih weight gain , will work to reverse this  ROS Denies recent fever or chills. Denies sinus pressure, nasal congestion, ear pain or sore throat. Denies chest congestion, productive cough or wheezing. Denies  palpitations and leg swelling Denies abdominal pain, nausea, vomiting,diarrhea or constipation.   Denies dysuria, frequency, hesitancy or incontinence.  Denies headaches, seizures, numbness, or tingling. Denies depression, anxiety or insomnia. Denies skin break down or rash.   PE  BP 102/67   Pulse 78   Resp 16   Ht 5\' 5"  (1.651 m)   Wt 168 lb 1.9 oz (76.3 kg)   SpO2 98%   BMI 27.98 kg/m   Patient alert and oriented and in no cardiopulmonary distress.  HEENT: No facial asymmetry, EOMI,     Neck supple .  Chest: Clear to auscultation bilaterally.  CVS: S1, S2 no murmurs, no S3.Regular rate.  ABD: Soft non tender.   Ext: No edema  MS: decreased  ROM thoiracic and lumbar spine, adequate in  Skin: Intact, no ulcerations or rash noted.  Psych: Good eye contact, normal affect. Memory intact not anxious or depressed appearing.  CNS: CN 2-12 intact, power,  normal throughout.no focal deficits noted.   Assessment & Plan  Iron deficiency anemia due to chronic blood loss Continue daily iron supplement and update prior to next  visit  Thoracic spine pain X ray of upper back and as needed muscle relaxer at bedtime  Lumbar spine pain Increased pain and stiffness , get X ray of lumbar spine  Vitamin D deficiency Updated lab needed at/ before next visit.

## 2020-04-07 NOTE — Patient Instructions (Addendum)
F/U end January , call if you need me sooner, phone or video  Please get CBC, fasting lipid, cmp and EGFR, TSH and Vit ,D iron and ferritin first week in January  Please sched Jan 14 or after mammogram before she leaves  Please get X ray of thoracic and lumbar spine today  Limited amount  of muscle relaxants for bedtime use is prescribed and use tylenol for pain ' 'Please get your pap it is overdue and I have referred you   It is important that you exercise regularly at least 30 minutes 5 times a week. If you develop chest pain, have severe difficulty breathing, or feel very tired, stop exercising immediately and seek medical attention   Think about what you will eat, plan ahead. Choose " clean, green, fresh or frozen" over canned, processed or packaged foods which are more sugary, salty and fatty. 70 to 75% of food eaten should be vegetables and fruit. Three meals at set times with snacks allowed between meals, but they must be fruit or vegetables. Aim to eat over a 12 hour period , example 7 am to 7 pm, and STOP after  your last meal of the day. Drink water,generally about 64 ounces per day, no other drink is as healthy. Fruit juice is best enjoyed in a healthy way, by EATING the fruit. Thanks for choosing [facility], we consider it a privelige to serve you.

## 2020-04-11 ENCOUNTER — Encounter: Payer: Self-pay | Admitting: Family Medicine

## 2020-04-11 NOTE — Assessment & Plan Note (Signed)
Continue daily iron supplement and update prior to next visit

## 2020-04-11 NOTE — Assessment & Plan Note (Signed)
Updated lab needed at/ before next visit.   

## 2020-04-11 NOTE — Assessment & Plan Note (Signed)
Increased pain and stiffness , get X ray of lumbar spine

## 2020-04-11 NOTE — Assessment & Plan Note (Signed)
X ray of upper back and as needed muscle relaxer at bedtime

## 2020-05-06 ENCOUNTER — Ambulatory Visit: Payer: Self-pay | Admitting: Family Medicine

## 2020-05-07 ENCOUNTER — Inpatient Hospital Stay (HOSPITAL_COMMUNITY): Payer: 59

## 2020-05-10 ENCOUNTER — Other Ambulatory Visit: Payer: Self-pay

## 2020-05-10 ENCOUNTER — Inpatient Hospital Stay (HOSPITAL_COMMUNITY): Payer: 59 | Attending: Hematology

## 2020-05-10 DIAGNOSIS — D72819 Decreased white blood cell count, unspecified: Secondary | ICD-10-CM | POA: Insufficient documentation

## 2020-05-10 DIAGNOSIS — Z79899 Other long term (current) drug therapy: Secondary | ICD-10-CM | POA: Diagnosis not present

## 2020-05-10 DIAGNOSIS — Z8249 Family history of ischemic heart disease and other diseases of the circulatory system: Secondary | ICD-10-CM | POA: Insufficient documentation

## 2020-05-10 DIAGNOSIS — D500 Iron deficiency anemia secondary to blood loss (chronic): Secondary | ICD-10-CM | POA: Diagnosis not present

## 2020-05-10 DIAGNOSIS — N920 Excessive and frequent menstruation with regular cycle: Secondary | ICD-10-CM | POA: Insufficient documentation

## 2020-05-10 LAB — COMPREHENSIVE METABOLIC PANEL
ALT: 15 U/L (ref 0–44)
AST: 17 U/L (ref 15–41)
Albumin: 4 g/dL (ref 3.5–5.0)
Alkaline Phosphatase: 45 U/L (ref 38–126)
Anion gap: 8 (ref 5–15)
BUN: 17 mg/dL (ref 6–20)
CO2: 30 mmol/L (ref 22–32)
Calcium: 9.7 mg/dL (ref 8.9–10.3)
Chloride: 101 mmol/L (ref 98–111)
Creatinine, Ser: 0.7 mg/dL (ref 0.44–1.00)
GFR calc Af Amer: >60 mL/min (ref >60)
GFR calc non Af Amer: >60 mL/min (ref >60)
Glucose, Bld: 91 mg/dL (ref 70–99)
Potassium: 3.8 mmol/L (ref 3.5–5.1)
Sodium: 139 mmol/L (ref 135–145)
Total Bilirubin: 0.9 mg/dL (ref 0.3–1.2)
Total Protein: 7.7 g/dL (ref 6.5–8.1)

## 2020-05-10 LAB — FERRITIN: Ferritin: 17 ng/mL (ref 11–307)

## 2020-05-10 LAB — CBC WITH DIFFERENTIAL/PLATELET
Abs Immature Granulocytes: 0.01 10*3/uL (ref 0.00–0.07)
Basophils Absolute: 0.1 10*3/uL (ref 0.0–0.1)
Basophils Relative: 1 %
Eosinophils Absolute: 0.1 10*3/uL (ref 0.0–0.5)
Eosinophils Relative: 1 %
HCT: 40.7 % (ref 36.0–46.0)
Hemoglobin: 12.2 g/dL (ref 12.0–15.0)
Immature Granulocytes: 0 %
Lymphocytes Relative: 38 %
Lymphs Abs: 1.6 10*3/uL (ref 0.7–4.0)
MCH: 21.9 pg — ABNORMAL LOW (ref 26.0–34.0)
MCHC: 30 g/dL (ref 30.0–36.0)
MCV: 73.2 fL — ABNORMAL LOW (ref 80.0–100.0)
Monocytes Absolute: 0.6 10*3/uL (ref 0.1–1.0)
Monocytes Relative: 14 %
Neutro Abs: 1.9 10*3/uL (ref 1.7–7.7)
Neutrophils Relative %: 46 %
Platelets: 196 10*3/uL (ref 150–400)
RBC: 5.56 MIL/uL — ABNORMAL HIGH (ref 3.87–5.11)
RDW: 15.7 % — ABNORMAL HIGH (ref 11.5–15.5)
WBC: 4.2 10*3/uL (ref 4.0–10.5)
nRBC: 0 % (ref 0.0–0.2)

## 2020-05-10 LAB — VITAMIN D 25 HYDROXY (VIT D DEFICIENCY, FRACTURES): Vit D, 25-Hydroxy: 40.99 ng/mL (ref 30–100)

## 2020-05-10 LAB — VITAMIN B12: Vitamin B-12: 344 pg/mL (ref 180–914)

## 2020-05-10 LAB — FOLATE: Folate: 11.9 ng/mL (ref >5.9)

## 2020-05-10 LAB — IRON AND TIBC
Iron: 121 ug/dL (ref 28–170)
Saturation Ratios: 29 % (ref 10.4–31.8)
TIBC: 414 ug/dL (ref 250–450)
UIBC: 293 ug/dL

## 2020-05-10 LAB — LACTATE DEHYDROGENASE: LDH: 120 U/L (ref 98–192)

## 2020-05-13 ENCOUNTER — Other Ambulatory Visit: Payer: 59 | Admitting: Adult Health

## 2020-05-14 ENCOUNTER — Other Ambulatory Visit: Payer: Self-pay

## 2020-05-14 ENCOUNTER — Inpatient Hospital Stay (HOSPITAL_BASED_OUTPATIENT_CLINIC_OR_DEPARTMENT_OTHER): Payer: 59 | Admitting: Nurse Practitioner

## 2020-05-14 DIAGNOSIS — D500 Iron deficiency anemia secondary to blood loss (chronic): Secondary | ICD-10-CM

## 2020-05-14 NOTE — Progress Notes (Signed)
[facility] Follow up:    Fayrene Helper, MD [address] [place] [address]   DIAGNOSIS: Iron deficiency anemia  CURRENT THERAPY: Intermittent iron infusions and oral iron therapy  INTERVAL HISTORY:  Dawn [name] 54 y.o. female was called for telephone visit for iron deficiency anemia.  Patient reports her energy levels are remaining good since she started the iron therapy back.  She denies any rectal bleeding per rectum or melena.  She denies any easy bruising or bleeding. Denies any nausea, vomiting, or diarrhea. Denies any new pains. Had not noticed any recent bleeding such as epistaxis, hematuria or hematochezia. Denies recent chest pain on exertion, shortness of breath on minimal exertion, pre-syncopal episodes, or palpitations. Denies any numbness or tingling in hands or feet. Denies any recent fevers, infections, or recent hospitalizations. Patient reports appetite at 100% and energy level at 75%.  She is eating well maintain her weight at this time.    Patient Active Problem List   Diagnosis Date Noted  . Thoracic spine pain 04/07/2020  . Lumbar spine pain 04/07/2020  . Panic attack 07/30/2019  . 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 Vaginitis 03/20/2019  . Hemorrhoids 03/20/2019  . Immunization refused 10/07/2018  . At risk for infectious disease due to recent foreign travel 05/21/2017  . Exposure to parasitic disease 04/30/2017  . Absolute anemia 03/28/2017  . Positive Lyme disease serology 03/22/2017  . [facility] spotted fever) 03/22/2017  . Fibroid 02/09/2017  . Enlarged uterus 01/30/2017  . Iron deficiency anemia due to chronic blood loss 01/30/2017  . Menorrhagia with regular cycle 01/30/2017  . 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 Vitamin D deficiency 12/28/2008    has No Known Allergies.  MEDICAL HISTORY: Past Medical History:  Diagnosis Date  . Anemia   . Anxiety 05/21/2017  . At risk for infectious  disease due to recent foreign travel 07/28/2018   pt traveled to [place]  . Burning with urination 08/17/2014  . BV (bacterial vaginosis) 01/21/2014  . Fibroid 02/09/2017  . Lyme disease   . [facility] spotted fever   . Vaginal discharge 01/21/2014  . Vaginal itching 11/03/2015    SURGICAL HISTORY: Past Surgical History:  Procedure Laterality Date  . CESAREAN SECTION    . COLONOSCOPY N/A 09/27/2017   Procedure: COLONOSCOPY;  Surgeon: Rogene Houston, MD;  Location: [facility];  Service: Endoscopy;  Laterality: N/A;  . ESOPHAGOGASTRODUODENOSCOPY N/A 09/27/2017   Procedure: ESOPHAGOGASTRODUODENOSCOPY (EGD);  Surgeon: Rogene Houston, MD;  Location: [facility];  Service: Endoscopy;  Laterality: N/A;  100-rescheduled to 09/27/17 @ 8:30am per Lelon Frohlich   . TUBAL LIGATION      SOCIAL HISTORY: Social History   Socioeconomic History  . Marital status: Widowed    Spouse name: Not on file  . Number of children: 2  . Years of education: Not on file  . Highest education level: Not on file  Occupational History  . Occupation: Theatre manager    Comment: recently retired  Tobacco Use  . Smoking status: Never Smoker  . Smokeless tobacco: Never Used  Vaping Use  . Vaping Use: Never used  Substance and Sexual Activity  . Alcohol use: No  . Drug use: No  . Sexual activity: Not Currently    Birth control/protection: Surgical    Comment: tubal  Other Topics Concern  . Not on file  Social History Narrative  . Not on file   Social  Determinants of Health   Financial Resource Strain:   . Difficulty of Paying Living Expenses: Not on file  Food Insecurity:   . Worried About Charity fundraiser in the Last Year: Not on file  . Ran Out of Food in the Last Year: Not on file  Transportation Needs:   . Lack of Transportation (Medical): Not on file  . Lack of Transportation (Non-Medical): Not on file  Physical Activity:   . Days of Exercise per Week: Not on file  . Minutes of Exercise per  Session: Not on file  Stress:   . Feeling of Stress : Not on file  Social Connections:   . Frequency of Communication with Friends and Family: Not on file  . Frequency of Social Gatherings with Friends and Family: Not on file  . Attends Religious Services: Not on file  . Active Member of Clubs or Organizations: Not on file  . Attends Archivist Meetings: Not on file  . Marital Status: Not on file  Intimate Partner Violence:   . Fear of Current or Ex-Partner: Not on file  . Emotionally Abused: Not on file  . Physically Abused: Not on file  . Sexually Abused: Not on file    FAMILY HISTORY: Family History  Problem Relation Age of Onset  . Hypertension Mother   . Hypertension Father   . Cancer Father   . Alzheimer's disease Maternal Grandmother   . Cancer Maternal Grandfather        colon  . Colon cancer Maternal Grandfather   . Cancer Paternal Grandfather        colon  . Colon cancer Paternal Grandfather     Review of Systems  All other systems reviewed and are negative.   Vital signs: -Deferred due to telephone visit  Physical Exam -Deferred due to telephone visit -Patient was alert and oriented the phone in no acute distress   LABORATORY DATA:  CBC    Component Value Date/Time   WBC 4.2 05/10/2020 1517   RBC 5.56 (H) 05/10/2020 1517   HGB 12.2 05/10/2020 1517   HCT 40.7 05/10/2020 1517   PLT 196 05/10/2020 1517   MCV 73.2 (L) 05/10/2020 1517   MCH 21.9 (L) 05/10/2020 1517   MCHC 30.0 05/10/2020 1517   RDW 15.7 (H) 05/10/2020 1517   LYMPHSABS 1.6 05/10/2020 1517   MONOABS 0.6 05/10/2020 1517   EOSABS 0.1 05/10/2020 1517   BASOSABS 0.1 05/10/2020 1517    CMP     Component Value Date/Time   NA 139 05/10/2020 1517   K 3.8 05/10/2020 1517   CL 101 05/10/2020 1517   CO2 30 05/10/2020 1517   GLUCOSE 91 05/10/2020 1517   BUN 17 05/10/2020 1517   CREATININE 0.70 05/10/2020 1517   CREATININE 0.69 10/07/2018 0850   CALCIUM 9.7 05/10/2020 1517    PROT 7.7 05/10/2020 1517   ALBUMIN 4.0 05/10/2020 1517   AST 17 05/10/2020 1517   ALT 15 05/10/2020 1517   ALKPHOS 45 05/10/2020 1517   BILITOT 0.9 05/10/2020 1517   GFRNONAA >60 05/10/2020 1517   GFRNONAA 101 10/07/2018 0850   GFRAA >60 05/10/2020 1517   GFRAA 117 10/07/2018 0850    All questions were answered to patient's stated satisfaction. Encouraged patient to call with any new concerns or questions before his next visit to the cancer center and we can certain see him sooner, if needed.     ASSESSMENT and THERAPY PLAN:   Iron deficiency anemia due  to chronic blood loss 1.  Iron deficiency anemia: - Likely due to menorrhagia. - She is currently taking oral iron supplements.  She reports it does cause mild constipation however she is managing this with over-the-counter stool softeners. -She has been seen by her GYN help control her heavy cycles. -She has been seen by GI.  Last EGD and colonoscopy was done on 09/27/2017 by Dr. Laural Golden and it showed normal esophagus, and external hemorrhoids. -Patient had an evaluation of her blood work done including ferritin, LDH, protein electrophoresis, hemoglobinopathy evaluation, vitamin B12, folate, methylmalonic acid, haptoglobin, SPEP. -SPEP was negative. - She denies any blood in her stool or urine. -She is still taking oral vitamin B 12.  She reports she has been taking her iron tablets every day. -Labs done on 05/10/2020 showed hemoglobin 12.2, ferritin seventeen, percent saturation twenty-nine -She will continue taking her oral iron tablets. - We will follow-up with her in 6 months to repeat labs.  2.  Leukopenia: - Likely a normal variant. - WBC initially was 2.9 with a normal differential. -An evaluation of her blood work was done including hepatitis B core antibody, total, hepatitis B surface antibody, qualitative, hepatitis B surface antigen, hepatitis C RNA quantitative, hepatitis panel, acute, HIV antibody, ANA, IFA, and SPEP.  All  were negative. - Labs on 05/10/2020 showed her WBC 4.2 -She denies any recurrent infections. -We will continue to monitor.    Orders Placed This Encounter  Procedures  . Lactate dehydrogenase    Standing Status:   Future    Standing Expiration Date:   05/14/2021  . CBC with Differential/Platelet    Standing Status:   Future    Standing Expiration Date:   05/14/2021  . Comprehensive metabolic panel    Standing Status:   Future    Standing Expiration Date:   05/14/2021  . Ferritin    Standing Status:   Future    Standing Expiration Date:   05/14/2021  . Iron and TIBC    Standing Status:   Future    Standing Expiration Date:   05/14/2021  . Vitamin B12    Standing Status:   Future    Standing Expiration Date:   05/14/2021  . VITAMIN D 25 Hydroxy (Vit-D Deficiency, Fractures)    Standing Status:   Future    Standing Expiration Date:   05/14/2021  . Folate    Standing Status:   Future    Standing Expiration Date:   05/14/2021    All questions were answered. The patient knows to call the clinic with any problems, questions or concerns. We can certainly see the patient much sooner if necessary. This note was electronically signed.  I provided 28 minutes of non face-to-face telephone visit time during this encounter, and > 50% was spent counseling as documented under my assessment & plan.   Glennie Isle, NP-C 05/14/2020

## 2020-05-14 NOTE — Assessment & Plan Note (Signed)
1.  Iron deficiency anemia: - Likely due to menorrhagia. - She is currently taking oral iron supplements.  She reports it does cause mild constipation however she is managing this with over-the-counter stool softeners. -She has been seen by her GYN help control her heavy cycles. -She has been seen by GI.  Last EGD and colonoscopy was done on 09/27/2017 by Dr. Laural Golden and it showed normal esophagus, and external hemorrhoids. -Patient had an evaluation of her blood work done including ferritin, LDH, protein electrophoresis, hemoglobinopathy evaluation, vitamin B12, folate, methylmalonic acid, haptoglobin, SPEP. -SPEP was negative. - She denies any blood in her stool or urine. -She is still taking oral vitamin B 12.  She reports she has been taking her iron tablets every day. -Labs done on 05/10/2020 showed hemoglobin 12.2, ferritin seventeen, percent saturation twenty-nine -She will continue taking her oral iron tablets. - We will follow-up with her in 6 months to repeat labs.  2.  Leukopenia: - Likely a normal variant. - WBC initially was 2.9 with a normal differential. -An evaluation of her blood work was done including hepatitis B core antibody, total, hepatitis B surface antibody, qualitative, hepatitis B surface antigen, hepatitis C RNA quantitative, hepatitis panel, acute, HIV antibody, ANA, IFA, and SPEP.  All were negative. - Labs on 05/10/2020 showed her WBC 4.2 -She denies any recurrent infections. -We will continue to monitor.

## 2020-06-16 ENCOUNTER — Encounter: Payer: Self-pay | Admitting: Adult Health

## 2020-06-16 ENCOUNTER — Ambulatory Visit (INDEPENDENT_AMBULATORY_CARE_PROVIDER_SITE_OTHER): Payer: 59 | Admitting: Adult Health

## 2020-06-16 ENCOUNTER — Other Ambulatory Visit (HOSPITAL_COMMUNITY)
Admission: RE | Admit: 2020-06-16 | Discharge: 2020-06-16 | Disposition: A | Discharge status: Home / Self Care | Payer: 59 | Source: Ambulatory Visit | Attending: Adult Health | Admitting: Adult Health

## 2020-06-16 ENCOUNTER — Other Ambulatory Visit: Payer: Self-pay

## 2020-06-16 VITALS — BP 112/70 | HR 68 | Ht 65.0 in | Wt 172.5 lb

## 2020-06-16 DIAGNOSIS — D219 Benign neoplasm of connective and other soft tissue, unspecified: Secondary | ICD-10-CM | POA: Diagnosis not present

## 2020-06-16 DIAGNOSIS — N852 Hypertrophy of uterus: Secondary | ICD-10-CM | POA: Diagnosis not present

## 2020-06-16 DIAGNOSIS — Z01419 Encounter for gynecological examination (general) (routine) without abnormal findings: Secondary | ICD-10-CM | POA: Diagnosis present

## 2020-06-16 DIAGNOSIS — Z1211 Encounter for screening for malignant neoplasm of colon: Secondary | ICD-10-CM | POA: Insufficient documentation

## 2020-06-16 DIAGNOSIS — N951 Menopausal and female climacteric states: Secondary | ICD-10-CM | POA: Insufficient documentation

## 2020-06-16 LAB — HEMOCCULT GUIAC POC 1CARD (OFFICE): Fecal Occult Blood, POC: NEGATIVE

## 2020-06-16 NOTE — Progress Notes (Signed)
Patient ID: Dawn [name], female   DOB: 23-Dec-1965, 54 y.o.   MRN: [id number] History of Present Illness: Anjanae is a  54 year old black female,widowed, G2P2, still having periods in for a well woman gyn exam and pap. She had skipped a period and last periods not as heavy, she has known fibroid. She did have hot flashes the month she skipped a period.  PCP is Dr Moshe Cipro.   Current Medications, Allergies, Past Medical History, Past Surgical History, Family History and Social History were reviewed in [employer] record.     Review of Systems: Patient denies any headaches, hearing loss, fatigue, blurred vision, shortness of breath, chest pain, abdominal pain, problems with bowel movements, urination, or intercourse(not active). No joint pain or mood swings.    Physical Exam:BP 112/70 (BP Location: Left Arm, Patient Position: Sitting, Cuff Size: Normal)   Pulse 68   Ht 5\' 5"  (1.651 m)   Wt 172 lb 8 oz (78.2 kg)   LMP 06/02/2020 (Approximate)   BMI 28.71 kg/m  General:  Well developed, well nourished, no acute distress Skin:  Warm and dry Neck:  Midline trachea, normal thyroid, good ROM, no lymphadenopathy Lungs; Clear to auscultation bilaterally Breast:  No dominant palpable mass, retraction, or nipple discharge Cardiovascular: Regular rate and rhythm Abdomen:  Soft, non tender, no hepatosplenomegaly Pelvic:  External genitalia is normal in appearance, no lesions.  The vagina is normal in appearance. Urethra has no lesions or masses. The cervix is bulbous and smooth, and pap with high risk HPV genotyping performed.  Uterus is enlarged has known fibroids.  No adnexal masses or tenderness noted.Bladder is non tender, no masses felt. Rectal: Good sphincter tone, no polyps, or hemorrhoids felt.  Hemoccult negative. Extremities/musculoskeletal:  No swelling or varicosities noted, no clubbing or cyanosis Psych:  No mood changes, alert and cooperative,seems happy AA is  0 Fall risk is low PHQ 9 score is 0  [facility] - 06/16/20 0948      Pregnancy Intention Screening   Does the patient want to become pregnant in the next year? N/A    Does the patient's partner want to become pregnant in the next year? N/A    Would the patient like to discuss contraceptive options today? N/A      Contraception Wrap Up   Current Method Female Sterilization    End Method Female Sterilization    Contraception Counseling Provided No         Examination chaperoned by Levy Pupa LPN  Impression and Plan: 1. Encounter for gynecological examination with Papanicolaou smear of cervix Pap sent Pap in 3 years if normal Can get physical with PCP Labs with PCP Colonoscopy per GI Mammogram yearly  2. Enlarged uterus  3. Fibroid  4. Encounter for screening fecal occult blood testing   5. Menopausal symptoms On average women stop at age 65 having periods  Discussed menopause and periods may get more irregular, and hot flashes may increase, call if no period for a year then sees blood, will get [place]

## 2020-06-21 LAB — CYTOLOGY - PAP
Comment: NEGATIVE
Diagnosis: UNDETERMINED — AB
High risk HPV: NEGATIVE

## 2020-10-07 ENCOUNTER — Other Ambulatory Visit: Payer: Self-pay

## 2020-10-07 ENCOUNTER — Telehealth: Payer: 59 | Admitting: Family Medicine

## 2020-10-07 ENCOUNTER — Ambulatory Visit: Payer: 59 | Admitting: Family Medicine

## 2020-10-07 DIAGNOSIS — Z2821 Immunization not carried out because of patient refusal: Secondary | ICD-10-CM | POA: Diagnosis not present

## 2020-10-07 DIAGNOSIS — D539 Nutritional anemia, unspecified: Secondary | ICD-10-CM | POA: Diagnosis not present

## 2020-10-07 MED ORDER — CALTRATE 600+D3 600-800 MG-UNIT PO TABS: ORAL_TABLET | ORAL | 5 refills | Status: DC

## 2020-10-07 NOTE — Progress Notes (Signed)
Virtual Visit via Telephone Note  I connected with [name] on 10/07/20 at  2:00 PM EST by telephone and verified that I am speaking with the correct person using two identifiers.  Location: Patient:home Provider:work   I discussed the limitations, risks, security and privacy concerns of performing an evaluation and management service by telephone and the availability of in person appointments. I also discussed with the patient that there may be a patient responsible charge related to this service. The patient expressed understanding and agreed to proceed.   History of Present Illness: F/U chronic problems and address any new or current concerns. Review and update medications and allergies. Review recent lab and radiologic data . Update routine health maintainace. Review an encourage improved health habits to include nutrition, exercise and  sleep .  Reports doing well , with no concerns Good sleep. No regular exercise, plans t work on this Denies sinus pressure, nasal congestion, ear pain or sore throat. Denies chest congestion, productive cough or wheezing. Denies chest pains, palpitations and leg swelling Denies abdominal pain, nausea, vomiting,diarrhea or constipation.   Denies dysuria, frequency, hesitancy or incontinence. Denies joint pain, swelling and limitation in mobility. Denies headaches, seizures, numbness, or tingling. Denies depression, anxiety or insomnia. Denies skin break down or rash.       Observations/Objective: There were no vitals taken for this visit. Good communication with no confusion and intact memory. Alert and oriented x 3 No signs of respiratory distress during speech    Assessment and Plan:  Immunization refused Re educated re need for standard adult immunizations, and also for covid booster  Anemia, deficiency Updated lab info needed   Follow Up Instructions:    I discussed the assessment and treatment plan with the  patient. The patient was provided an opportunity to ask questions and all were answered. The patient agreed with the plan and demonstrated an understanding of the instructions.   The patient was advised to call back or seek an in-person evaluation if the symptoms worsen or if the condition fails to improve as anticipated.  I provided 8 minutes of non-face-to-face time during this encounter.   Tula Nakayama, MD

## 2020-10-07 NOTE — Patient Instructions (Addendum)
Annual exam, no pap end [place], call if you need me sooner  Please call in Septemebr for labs needed  Please get the covid booster   Continue to think on standard recommended  Vaccines for an adult your age  Please schedule mammogram, morning appointment , per pt request at checkout  Please get CBC, iron,ferritin andB12 in the next 1 week  Please commit to Caltrate with D3 ( [phone]) one tablet twice daily for bone and muscle health, this is sent to your pharmacy ( no need for vit D on its own, take it with the calcium please)  It is important that you exercise regularly at least 30 minutes 5 times a week. If you develop chest pain, have severe difficulty breathing, or feel very tired, stop exercising immediately and seek medical attention  Think about what you will eat, plan ahead. Choose " clean, green, fresh or frozen" over canned, processed or packaged foods which are more sugary, salty and fatty. 70 to 75% of food eaten should be vegetables and fruit. Three meals at set times with snacks allowed between meals, but they must be fruit or vegetables. Aim to eat over a 12 hour period , example 7 am to 7 pm, and STOP after  your last meal of the day. Drink water,generally about 64 ounces per day, no other drink is as healthy. Fruit juice is best enjoyed in a healthy way, by EATING the fruit. Thanks for choosing [facility], we consider it a privelige to serve you.

## 2020-10-10 ENCOUNTER — Encounter: Payer: Self-pay | Admitting: Family Medicine

## 2020-10-10 DIAGNOSIS — D539 Nutritional anemia, unspecified: Secondary | ICD-10-CM | POA: Insufficient documentation

## 2020-10-10 NOTE — Assessment & Plan Note (Signed)
Updated lab info needed

## 2020-10-10 NOTE — Assessment & Plan Note (Signed)
Re educated re need for standard adult immunizations, and also for covid booster

## 2020-10-20 ENCOUNTER — Other Ambulatory Visit: Payer: Self-pay

## 2020-10-20 ENCOUNTER — Ambulatory Visit (HOSPITAL_COMMUNITY)
Admission: RE | Admit: 2020-10-20 | Discharge: 2020-10-20 | Disposition: A | Discharge status: Home / Self Care | Payer: 59 | Source: Ambulatory Visit | Attending: Family Medicine | Admitting: Family Medicine

## 2020-10-20 ENCOUNTER — Encounter (HOSPITAL_COMMUNITY): Payer: Self-pay

## 2020-10-20 DIAGNOSIS — Z1231 Encounter for screening mammogram for malignant neoplasm of breast: Secondary | ICD-10-CM | POA: Insufficient documentation

## 2020-10-25 ENCOUNTER — Other Ambulatory Visit (HOSPITAL_COMMUNITY): Payer: Self-pay | Admitting: Family Medicine

## 2020-10-25 DIAGNOSIS — R928 Other abnormal and inconclusive findings on diagnostic imaging of breast: Secondary | ICD-10-CM

## 2020-10-29 ENCOUNTER — Other Ambulatory Visit: Payer: Self-pay

## 2020-10-29 ENCOUNTER — Ambulatory Visit (HOSPITAL_COMMUNITY)
Admission: RE | Admit: 2020-10-29 | Discharge: 2020-10-29 | Disposition: A | Discharge status: Home / Self Care | Payer: 59 | Source: Ambulatory Visit | Attending: Family Medicine | Admitting: Family Medicine

## 2020-10-29 DIAGNOSIS — R928 Other abnormal and inconclusive findings on diagnostic imaging of breast: Secondary | ICD-10-CM

## 2020-11-10 ENCOUNTER — Other Ambulatory Visit (HOSPITAL_COMMUNITY): Payer: Self-pay

## 2020-11-10 DIAGNOSIS — D500 Iron deficiency anemia secondary to blood loss (chronic): Secondary | ICD-10-CM

## 2020-11-11 ENCOUNTER — Inpatient Hospital Stay (HOSPITAL_COMMUNITY): Payer: 59 | Attending: Hematology

## 2020-11-11 ENCOUNTER — Other Ambulatory Visit: Payer: Self-pay

## 2020-11-11 DIAGNOSIS — D509 Iron deficiency anemia, unspecified: Secondary | ICD-10-CM | POA: Diagnosis present

## 2020-11-11 DIAGNOSIS — E559 Vitamin D deficiency, unspecified: Secondary | ICD-10-CM | POA: Diagnosis not present

## 2020-11-11 DIAGNOSIS — D500 Iron deficiency anemia secondary to blood loss (chronic): Secondary | ICD-10-CM

## 2020-11-11 LAB — CBC WITH DIFFERENTIAL/PLATELET
Abs Immature Granulocytes: 0.01 10*3/uL (ref 0.00–0.07)
Basophils Absolute: 0.1 10*3/uL (ref 0.0–0.1)
Basophils Relative: 1 %
Eosinophils Absolute: 0 10*3/uL (ref 0.0–0.5)
Eosinophils Relative: 1 %
HCT: 41 % (ref 36.0–46.0)
Hemoglobin: 12.3 g/dL (ref 12.0–15.0)
Immature Granulocytes: 0 %
Lymphocytes Relative: 32 %
Lymphs Abs: 1.2 10*3/uL (ref 0.7–4.0)
MCH: 21.6 pg — ABNORMAL LOW (ref 26.0–34.0)
MCHC: 30 g/dL (ref 30.0–36.0)
MCV: 72.1 fL — ABNORMAL LOW (ref 80.0–100.0)
Monocytes Absolute: 0.4 10*3/uL (ref 0.1–1.0)
Monocytes Relative: 11 %
Neutro Abs: 2.1 10*3/uL (ref 1.7–7.7)
Neutrophils Relative %: 55 %
Platelets: 216 10*3/uL (ref 150–400)
RBC: 5.69 MIL/uL — ABNORMAL HIGH (ref 3.87–5.11)
RDW: 18 % — ABNORMAL HIGH (ref 11.5–15.5)
WBC: 3.9 10*3/uL — ABNORMAL LOW (ref 4.0–10.5)
nRBC: 0 % (ref 0.0–0.2)

## 2020-11-11 LAB — COMPREHENSIVE METABOLIC PANEL
ALT: 21 U/L (ref 0–44)
AST: 19 U/L (ref 15–41)
Albumin: 3.6 g/dL (ref 3.5–5.0)
Alkaline Phosphatase: 53 U/L (ref 38–126)
Anion gap: 8 (ref 5–15)
BUN: 14 mg/dL (ref 6–20)
CO2: 26 mmol/L (ref 22–32)
Calcium: 9 mg/dL (ref 8.9–10.3)
Chloride: 102 mmol/L (ref 98–111)
Creatinine, Ser: 0.59 mg/dL (ref 0.44–1.00)
GFR, Estimated: >60 mL/min (ref >60)
Glucose, Bld: 87 mg/dL (ref 70–99)
Potassium: 3.7 mmol/L (ref 3.5–5.1)
Sodium: 136 mmol/L (ref 135–145)
Total Bilirubin: 1.1 mg/dL (ref 0.3–1.2)
Total Protein: 7.7 g/dL (ref 6.5–8.1)

## 2020-11-11 LAB — IRON AND TIBC
Iron: 105 ug/dL (ref 28–170)
Saturation Ratios: 25 % (ref 10.4–31.8)
TIBC: 419 ug/dL (ref 250–450)
UIBC: 314 ug/dL

## 2020-11-11 LAB — VITAMIN D 25 HYDROXY (VIT D DEFICIENCY, FRACTURES): Vit D, 25-Hydroxy: 12.28 ng/mL — ABNORMAL LOW (ref 30–100)

## 2020-11-11 LAB — FERRITIN: Ferritin: 12 ng/mL (ref 11–307)

## 2020-11-11 LAB — VITAMIN B12: Vitamin B-12: 352 pg/mL (ref 180–914)

## 2020-11-11 LAB — FOLATE: Folate: 8.2 ng/mL (ref >5.9)

## 2020-11-11 LAB — LACTATE DEHYDROGENASE: LDH: 111 U/L (ref 98–192)

## 2020-11-18 ENCOUNTER — Other Ambulatory Visit: Payer: Self-pay

## 2020-11-18 ENCOUNTER — Inpatient Hospital Stay (HOSPITAL_BASED_OUTPATIENT_CLINIC_OR_DEPARTMENT_OTHER): Payer: 59 | Admitting: Physician Assistant

## 2020-11-18 DIAGNOSIS — D563 Thalassemia minor: Secondary | ICD-10-CM

## 2020-11-18 DIAGNOSIS — D500 Iron deficiency anemia secondary to blood loss (chronic): Secondary | ICD-10-CM | POA: Diagnosis not present

## 2020-11-18 DIAGNOSIS — D582 Other hemoglobinopathies: Secondary | ICD-10-CM | POA: Diagnosis not present

## 2020-11-18 NOTE — Progress Notes (Signed)
Virtual Visit via Telephone Note [facility]  I connected with Dawn [name]  on 11/18/20  at  9:59 AM  by telephone and verified that I am speaking with the correct person using two identifiers.  Location: Patient: Home Provider: [facility]   I discussed the limitations, risks, security and privacy concerns of performing an evaluation and management service by telephone and the availability of in person appointments. I also discussed with the patient that there may be a patient responsible charge related to this service. The patient expressed understanding and agreed to proceed.  DIAGNOSIS: Iron deficiency anemia  CURRENT THERAPY: Oral iron therapy  History of Present Illness: Dawn [name] 55 y.o. female presents for telephone visit for ongoing management of her iron deficiency anemia.  Her last encounter was 05/14/2020, virtual visit with NP Dawn [name].  Patient reports that she has been doing well since her last visit.  Reports good energy level and normal appetite.  Denies unintentional weight loss. Her anemia has been determined to be secondary to menorrhagia, but the patient reports that she is now perimenopausal - last menstrual period was January 2022, less heavy than usual.  She has also began to experience intermittent hot flashes. She denies significant fatigue. She denies recent chest pain on exertion, shortness of breath on minimal exertion, pre-syncopal episodes, or palpitations. She had not noticed any other recent bleeding such as epistaxis, hematuria, hematochezia, or melena. She denies any pica and eats a variety of diet. The patient was prescribed oral iron supplements, but admits that she does not take it as regularly as she ought to, only about 3 times per week.  She denies significant side effects such as constipation or indigestion.  Observations/Objective: Review of Systems  Constitutional: Negative for chills, diaphoresis,  fever, malaise/fatigue and weight loss.  Respiratory: Negative for cough and shortness of breath.   Cardiovascular: Negative for chest pain, palpitations and leg swelling.  Gastrointestinal: Negative for abdominal pain, blood in stool, constipation, diarrhea, melena, nausea and vomiting.  Genitourinary: Negative for hematuria.  Skin: Negative for itching and rash.  Neurological: Negative for dizziness.  Endo/Heme/Allergies: Does not bruise/bleed easily.   PHYSICAL EXAM (per limitations of virtual telephone visit): The patient is alert and oriented x 3, exhibiting adequate mentation, good mood, and ability to speak in full sentences and execute sound judgement.  CBC    Component Value Date/Time   WBC 3.9 (L) 11/11/2020 1030   RBC 5.69 (H) 11/11/2020 1030   HGB 12.3 11/11/2020 1030   HCT 41.0 11/11/2020 1030   PLT 216 11/11/2020 1030   MCV 72.1 (L) 11/11/2020 1030   MCH 21.6 (L) 11/11/2020 1030   MCHC 30.0 11/11/2020 1030   RDW 18.0 (H) 11/11/2020 1030   LYMPHSABS 1.2 11/11/2020 1030   MONOABS 0.4 11/11/2020 1030   EOSABS 0.0 11/11/2020 1030   BASOSABS 0.1 11/11/2020 1030   Iron/TIBC/Ferritin/ %Sat    Component Value Date/Time   IRON 105 11/11/2020 1030   TIBC 419 11/11/2020 1030   FERRITIN 12 11/11/2020 1030   IRONPCTSAT 25 11/11/2020 1030   IRONPCTSAT 14 03/23/2017 0825   Last vitamin D Lab Results  Component Value Date   VD25OH 12.28 (L) 11/11/2020   Last vitamin B12 and Folate Lab Results  Component Value Date   VITAMINB12 352 11/11/2020   FOLATE 8.2 11/11/2020   Assessment: 1.  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 Laural Golden showing normal esophagus and external  hemorrhoids -Patient had work-up of anemia including ferritin, LDH, protein, electrophoresis, hemoglobinopathy evaluation, vitamin B12, folate, methylmalonic acid, haptoglobin, and SPEP -Hemoglobinopathy panel on 12/16/2018 showed elevated HgbA2 of 4.8, consistent with beta thalassemia minor, however clinical picture favors primary etiology to be iron deficiency with likely concurrent beta thalassemia minor (patient also noted to have elevated RBC 5.69, with low MCV 72.1 and low MCH 21.6 despite normal hemoglobin 12.2) -Most recent labs reviewed from 11/11/2020 showed normal hemoglobin 12.3, ferritin 12, iron saturation 25%  2.  Vitamin D deficiency -Vitamin D 25-hydroxy noted to be 12.28 -Patient is not currently on vitamin D supplementations, will start at this visit  3. Leukopenia -Chronic mild remittent leukopenia with WBC ranging from 2.9-7.4, with normal differential -Patient denies frequent infections -Work-up done in April 2020 was negative ([place] B/C, HIV, ANA, IFA, SPEP) -Likely normal variant  And Plan: 1.  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  Vitamin D deficiency -Patient instructed to start taking over-the-counter vitamin D3 supplement, 2000 units daily -Right vitamin D in 6 months at follow-up visit  3.  Leukopenia -Continue to monitor follow-up visits, no intervention needed at this time   Follow Up Instructions: -Increase oral iron to 325 mg elemental iron twice daily -Start taking vitamin D3 supplement, 2000 units daily -CBC, vitamin D3, iron/TIBC, ferritin in 6 months -RTC in 6 months for follow-up visit to discuss labs  I discussed the  assessment and treatment plan with the patient. The patient was provided an opportunity to ask questions and all were answered. The patient agreed with the plan and demonstrated an understanding of the instructions.   The patient was advised to call back or seek an in-person evaluation if the symptoms worsen or if the condition fails to improve as anticipated.  I provided 20 minutes of non-face-to-face time during this encounter. Medical decision making: [place], PA-C [facility]

## 2021-05-17 ENCOUNTER — Other Ambulatory Visit (HOSPITAL_COMMUNITY): Payer: Self-pay | Admitting: *Deleted

## 2021-05-17 DIAGNOSIS — D500 Iron deficiency anemia secondary to blood loss (chronic): Secondary | ICD-10-CM

## 2021-05-19 ENCOUNTER — Inpatient Hospital Stay (HOSPITAL_COMMUNITY): Payer: 59

## 2021-05-26 ENCOUNTER — Ambulatory Visit (HOSPITAL_COMMUNITY): Payer: 59 | Admitting: Physician Assistant

## 2021-06-02 ENCOUNTER — Inpatient Hospital Stay (HOSPITAL_COMMUNITY): Payer: 59

## 2021-06-09 ENCOUNTER — Ambulatory Visit (HOSPITAL_COMMUNITY): Payer: 59 | Admitting: Physician Assistant

## 2021-06-23 ENCOUNTER — Encounter: Payer: 59 | Admitting: Family Medicine

## 2021-09-20 ENCOUNTER — Other Ambulatory Visit: Payer: Self-pay

## 2021-09-20 ENCOUNTER — Encounter: Payer: Self-pay | Admitting: Family Medicine

## 2021-09-20 ENCOUNTER — Ambulatory Visit: Payer: 59 | Admitting: Family Medicine

## 2021-09-20 VITALS — BP 116/74 | HR 101 | Ht 65.0 in | Wt 168.0 lb

## 2021-09-20 DIAGNOSIS — R221 Localized swelling, mass and lump, neck: Secondary | ICD-10-CM

## 2021-09-20 DIAGNOSIS — E559 Vitamin D deficiency, unspecified: Secondary | ICD-10-CM

## 2021-09-20 DIAGNOSIS — Z2821 Immunization not carried out because of patient refusal: Secondary | ICD-10-CM | POA: Diagnosis not present

## 2021-09-20 DIAGNOSIS — E010 Iodine-deficiency related diffuse (endemic) goiter: Secondary | ICD-10-CM | POA: Diagnosis not present

## 2021-09-20 DIAGNOSIS — N6312 Unspecified lump in the right breast, upper inner quadrant: Secondary | ICD-10-CM | POA: Diagnosis not present

## 2021-09-20 DIAGNOSIS — Z1231 Encounter for screening mammogram for malignant neoplasm of breast: Secondary | ICD-10-CM

## 2021-09-20 DIAGNOSIS — Z13220 Encounter for screening for lipoid disorders: Secondary | ICD-10-CM

## 2021-09-20 DIAGNOSIS — R7989 Other specified abnormal findings of blood chemistry: Secondary | ICD-10-CM

## 2021-09-20 NOTE — Patient Instructions (Addendum)
F/U in 6 months, call if you need me sooner  Please schedule left breast US as a follow up of mass which is past due at checkout  You are referred for [place] of neck, I think the lump is your thyroid gland, we will call with appt info  Please reconsider and get shingles vaccines  CBC, lipid, cmp and EGFr, TSH and vit D today ( we will call you on cell with results)  It is important that you exercise regularly at least 30 minutes 5 times a week. If you develop chest pain, have severe difficulty breathing, or feel very tired, stop exercising immediately and seek medical attention  Thanks for choosing [facility], we consider it a privelige to serve you.

## 2021-09-20 NOTE — Assessment & Plan Note (Signed)
Refuses shingrix

## 2021-09-21 LAB — CMP14+EGFR
ALT: 13 IU/L (ref 0–32)
AST: 15 IU/L (ref 0–40)
Albumin/Globulin Ratio: 1.2 (ref 1.2–2.2)
Albumin: 4.4 g/dL (ref 3.8–4.9)
Alkaline Phosphatase: 91 IU/L (ref 44–121)
BUN/Creatinine Ratio: 17 (ref 9–23)
BUN: 13 mg/dL (ref 6–24)
Bilirubin Total: 0.5 mg/dL (ref 0.0–1.2)
CO2: 24 mmol/L (ref 20–29)
Calcium: 9.5 mg/dL (ref 8.7–10.2)
Chloride: 100 mmol/L (ref 96–106)
Creatinine, Ser: 0.75 mg/dL (ref 0.57–1.00)
Globulin, Total: 3.8 g/dL (ref 1.5–4.5)
Glucose: 91 mg/dL (ref 70–99)
Potassium: 4.2 mmol/L (ref 3.5–5.2)
Sodium: 140 mmol/L (ref 134–144)
Total Protein: 8.2 g/dL (ref 6.0–8.5)
eGFR: 95 mL/min/{1.73_m2} (ref >59)

## 2021-09-21 LAB — LIPID PANEL
Chol/HDL Ratio: 2.9 ratio (ref 0.0–4.4)
Cholesterol, Total: 217 mg/dL — ABNORMAL HIGH (ref 100–199)
HDL: 74 mg/dL (ref >39)
LDL Chol Calc (NIH): 132 mg/dL — ABNORMAL HIGH (ref 0–99)
Triglycerides: 60 mg/dL (ref 0–149)
VLDL Cholesterol Cal: 11 mg/dL (ref 5–40)

## 2021-09-21 LAB — CBC
Hematocrit: 42.2 % (ref 34.0–46.6)
Hemoglobin: 12.8 g/dL (ref 11.1–15.9)
MCH: 21.1 pg — ABNORMAL LOW (ref 26.6–33.0)
MCHC: 30.3 g/dL — ABNORMAL LOW (ref 31.5–35.7)
MCV: 69 fL — ABNORMAL LOW (ref 79–97)
Platelets: 255 10*3/uL (ref 150–450)
RBC: 6.08 x10E6/uL — ABNORMAL HIGH (ref 3.77–5.28)
RDW: 18.2 % — ABNORMAL HIGH (ref 11.7–15.4)
WBC: 4.7 10*3/uL (ref 3.4–10.8)

## 2021-09-21 LAB — TSH: TSH: 1.25 u[IU]/mL (ref 0.450–4.500)

## 2021-09-21 LAB — VITAMIN D 25 HYDROXY (VIT D DEFICIENCY, FRACTURES): Vit D, 25-Hydroxy: 15.9 ng/mL — ABNORMAL LOW (ref 30.0–100.0)

## 2021-09-22 MED ORDER — ERGOCALCIFEROL 1.25 MG (50000 UT) PO CAPS: 50000.0000 [IU] | ORAL_CAPSULE | ORAL | 2 refills | Status: DC

## 2021-09-25 ENCOUNTER — Telehealth: Payer: Self-pay | Admitting: Family Medicine

## 2021-09-25 DIAGNOSIS — N6310 Unspecified lump in the right breast, unspecified quadrant: Secondary | ICD-10-CM | POA: Insufficient documentation

## 2021-09-25 NOTE — Telephone Encounter (Signed)
Pls follow through and schedule [place] right breast  which I entered, past due since last year, I also ordered mammogram coming up, I put breast center, but whichever is available unless she has a preference ?? Pls ask

## 2021-09-25 NOTE — Assessment & Plan Note (Signed)
Persistent, needs to commit to active treatment , medication prescribed

## 2021-09-25 NOTE — Assessment & Plan Note (Signed)
Right neck mass, likely thyroid gland, [place] ordered

## 2021-09-25 NOTE — Progress Notes (Signed)
° °  Dawn [name]     MRN: [id number]      DOB: 1965/10/28   HPI Ms. Cosper is here for follow up a Preventive health is updated, specifically  Cancer screening and Immunization.  Both need updating C/o right neck swelling, painless x 2 days ROS Denies recent fever or chills. Denies sinus pressure, nasal congestion, ear pain or sore throat. Denies chest congestion, productive cough or wheezing. Denies chest pains, palpitations and leg swelling Denies abdominal pain, nausea, vomiting,diarrhea or constipation.   Denies dysuria, frequency, hesitancy or incontinence. Denies joint pain, swelling and limitation in mobility. Denies headaches, seizures, numbness, or tingling. Denies depression, anxiety or insomnia. Denies skin break down or rash. No regular exercise will work on this  PE  BP 116/74    Pulse (!) 101    Ht 5\' 5"  (1.651 m)    Wt 168 lb 0.6 oz (76.2 kg)    SpO2 99%    BMI 27.96 kg/m   Patient alert and oriented and in no cardiopulmonary distress.  HEENT: No facial asymmetry, EOMI,     Neck supple .Painless swelling of right neck,no cervical adenopathy  Chest: Clear to auscultation bilaterally.  CVS: S1, S2 no murmurs, no S3.Regular rate.  ABD: Soft non tender.   Ext: No edema  MS: Adequate ROM spine, shoulders, hips and knees.  Skin: Intact, no ulcerations or rash noted.  Psych: Good eye contact, normal affect. Memory intact not anxious or depressed appearing.  CNS: CN 2-12 intact, power,  normal throughout.no focal deficits noted.   Assessment & Plan  Immunization refused Refuses shingrix  Neck mass Right neck mass, likely thyroid gland, [place] ordered  Vitamin D deficiency Persistent, needs to commit to active treatment , medication prescribed  Breast mass, right Report is likely a cyst, however f/u imaging past due will order as stat,and call for appt, message telephone will be sent to nursing staff also. Screening mamogram is due end Fb, this is also  ordered

## 2021-09-25 NOTE — Assessment & Plan Note (Signed)
Report is likely a cyst, however f/u imaging past due will order as stat,and call for appt, message telephone will be sent to nursing staff also. Screening mamogram is due end Fb, this is also ordered

## 2021-09-28 NOTE — Telephone Encounter (Signed)
Scheduled for 2/24 breast center

## 2021-09-29 ENCOUNTER — Other Ambulatory Visit: Payer: Self-pay | Admitting: Family Medicine

## 2021-09-29 DIAGNOSIS — N6312 Unspecified lump in the right breast, upper inner quadrant: Secondary | ICD-10-CM

## 2021-09-30 ENCOUNTER — Other Ambulatory Visit: Payer: Self-pay

## 2021-09-30 ENCOUNTER — Ambulatory Visit (HOSPITAL_COMMUNITY)
Admission: RE | Admit: 2021-09-30 | Discharge: 2021-09-30 | Disposition: A | Discharge status: Home / Self Care | Payer: 59 | Source: Ambulatory Visit | Attending: Family Medicine | Admitting: Family Medicine

## 2021-09-30 DIAGNOSIS — R221 Localized swelling, mass and lump, neck: Secondary | ICD-10-CM | POA: Diagnosis present

## 2021-09-30 DIAGNOSIS — E010 Iodine-deficiency related diffuse (endemic) goiter: Secondary | ICD-10-CM | POA: Diagnosis present

## 2021-10-21 ENCOUNTER — Ambulatory Visit: Payer: 59

## 2021-10-26 ENCOUNTER — Other Ambulatory Visit: Payer: Self-pay | Admitting: Family Medicine

## 2021-10-26 DIAGNOSIS — N6312 Unspecified lump in the right breast, upper inner quadrant: Secondary | ICD-10-CM

## 2021-10-28 ENCOUNTER — Other Ambulatory Visit: Payer: 59

## 2021-11-14 ENCOUNTER — Ambulatory Visit
Admission: RE | Admit: 2021-11-14 | Discharge: 2021-11-14 | Disposition: A | Discharge status: Home / Self Care | Payer: 59 | Source: Ambulatory Visit | Attending: Family Medicine | Admitting: Family Medicine

## 2021-11-14 ENCOUNTER — Other Ambulatory Visit: Payer: Self-pay

## 2021-11-14 DIAGNOSIS — N6312 Unspecified lump in the right breast, upper inner quadrant: Secondary | ICD-10-CM

## 2022-01-11 ENCOUNTER — Ambulatory Visit: Payer: 59 | Admitting: Family Medicine

## 2022-01-11 ENCOUNTER — Encounter: Payer: Self-pay | Admitting: Family Medicine

## 2022-01-11 VITALS — BP 120/75 | HR 66 | Ht 65.0 in | Wt 173.1 lb

## 2022-01-11 DIAGNOSIS — Z1211 Encounter for screening for malignant neoplasm of colon: Secondary | ICD-10-CM

## 2022-01-11 DIAGNOSIS — E559 Vitamin D deficiency, unspecified: Secondary | ICD-10-CM | POA: Diagnosis not present

## 2022-01-11 DIAGNOSIS — E663 Overweight: Secondary | ICD-10-CM

## 2022-01-11 DIAGNOSIS — D539 Nutritional anemia, unspecified: Secondary | ICD-10-CM

## 2022-01-11 DIAGNOSIS — Z2821 Immunization not carried out because of patient refusal: Secondary | ICD-10-CM

## 2022-01-11 DIAGNOSIS — R7303 Prediabetes: Secondary | ICD-10-CM

## 2022-01-11 DIAGNOSIS — Z13220 Encounter for screening for lipoid disorders: Secondary | ICD-10-CM

## 2022-01-11 NOTE — Patient Instructions (Signed)
F/U in 6 months, call if you need me sooner ? ?Please arrange cologuard if covered by her ins ? ?Please change lifestyle as we discussed ? ?Fasting lipid and vit D and blood glucose 5 days before next visit. ? ?Start once daily otC vit D3  1000 IU after you finish the weekly vit D prescribed for 9 months ? ?No abnormality in you ear to explain the noise you are hearing , call for ENT referral if persists or worsens ? ?It is important that you exercise regularly at least 30 minutes 5 times a week. If you develop chest pain, have severe difficulty breathing, or feel very tired, stop exercising immediately and seek medical attention  ? ? ?Thanks for choosing [facility], we consider it a privelige to serve you. ? ?

## 2022-01-15 ENCOUNTER — Encounter: Payer: Self-pay | Admitting: Family Medicine

## 2022-01-15 DIAGNOSIS — E663 Overweight: Secondary | ICD-10-CM | POA: Insufficient documentation

## 2022-01-15 NOTE — Progress Notes (Signed)
   Dawn [name]     MRN: [id number]      DOB: Mar 14, 1966   HPI Dawn [name] is here for follow up and re-evaluation of chronic medical conditions, medication management and review of any available recent lab and radiology data.  Preventive health is updated, specifically  Cancer screening and Immunization.   Questions or concerns regarding consultations or procedures which the PT has had in the interim are  addressed. The PT denies any adverse reactions to current medications since the last visit.  Still not exercising and as involved in life as she could/ should be, willing to make small changes  C/o clicking nose in her ear ROS Denies recent fever or chills. Denies sinus pressure, nasal congestion,  or sore throat. Denies chest congestion, productive cough or wheezing. Denies chest pains, palpitations and leg swelling Denies abdominal pain, nausea, vomiting,diarrhea or constipation.   Denies dysuria, frequency, hesitancy or incontinence. Denies joint pain, swelling and limitation in mobility. Denies headaches, seizures, numbness, or tingling. Denies depression, anxiety or insomnia. Denies skin break down or rash.   PE  BP 120/75   Pulse 66   Ht '5\' 5"'$  (1.651 m)   Wt 173 lb 1.9 oz (78.5 kg)   SpO2 100%   BMI 28.81 kg/m   Patient alert and oriented and in no cardiopulmonary distress.  HEENT: No facial asymmetry, EOMI,     Neck supple .TM clear bilaterally, no cerumen impaction, good light reflex  Chest: Clear to auscultation bilaterally.  CVS: S1, S2 no murmurs, no S3.Regular rate.  ABD: Soft non tender.   Ext: No edema  MS: Adequate ROM spine, shoulders, hips and knees.  Skin: Intact, no ulcerations or rash noted.  Psych: Good eye contact, normal affect. Memory intact not anxious or depressed appearing.  CNS: CN 2-12 intact, power,  normal throughout.no focal deficits noted.   Assessment & Plan  Vitamin D deficiency Updated lab needed at/ before next  visit. Change to oTC supplement   Immunization refused Offered and refuses shingrix  Overweight (BMI 25.0-29.9)  Patient re-educated about  the importance of commitment to a  minimum of 150 minutes of exercise per week as able.  The importance of healthy food choices with portion control discussed, as well as eating regularly and within a 12 hour window most days. The need to choose "clean , green" food 50 to 75% of the time is discussed, as well as to make water the primary drink and set a goal of 64 ounces water daily.       01/11/2022    8:25 AM 09/20/2021    1:16 PM 06/16/2020    9:43 AM  Weight /BMI  Weight 173 lb 1.9 oz 168 lb 0.6 oz 172 lb 8 oz  Height '5\' 5"'$  (1.651 m) '5\' 5"'$  (1.651 m) '5\' 5"'$  (1.651 m)  BMI 28.81 kg/m2 27.96 kg/m2 28.71 kg/m2

## 2022-01-15 NOTE — Assessment & Plan Note (Signed)
Offered and refuses shingrix

## 2022-01-15 NOTE — Assessment & Plan Note (Signed)
  Patient re-educated about  the importance of commitment to a  minimum of 150 minutes of exercise per week as able.  The importance of healthy food choices with portion control discussed, as well as eating regularly and within a 12 hour window most days. The need to choose "clean , green" food 50 to 75% of the time is discussed, as well as to make water the primary drink and set a goal of 64 ounces water daily.       01/11/2022    8:25 AM 09/20/2021    1:16 PM 06/16/2020    9:43 AM  Weight /BMI  Weight 173 lb 1.9 oz 168 lb 0.6 oz 172 lb 8 oz  Height '5\' 5"'$  (1.651 m) '5\' 5"'$  (1.651 m) '5\' 5"'$  (1.651 m)  BMI 28.81 kg/m2 27.96 kg/m2 28.71 kg/m2

## 2022-01-15 NOTE — Assessment & Plan Note (Signed)
Updated lab needed at/ before next visit. Change to oTC supplement

## 2022-03-21 ENCOUNTER — Ambulatory Visit: Payer: 59 | Admitting: Family Medicine

## 2022-07-14 ENCOUNTER — Ambulatory Visit: Payer: 59 | Admitting: Family Medicine

## 2022-08-02 ENCOUNTER — Ambulatory Visit (INDEPENDENT_AMBULATORY_CARE_PROVIDER_SITE_OTHER): Payer: 59

## 2022-08-02 ENCOUNTER — Ambulatory Visit: Payer: 59 | Admitting: Orthopedic Surgery

## 2022-08-02 VITALS — BP 116/68 | HR 70 | Ht 65.0 in | Wt 172.0 lb

## 2022-08-02 DIAGNOSIS — M7662 Achilles tendinitis, left leg: Secondary | ICD-10-CM | POA: Diagnosis not present

## 2022-08-02 DIAGNOSIS — M25572 Pain in left ankle and joints of left foot: Secondary | ICD-10-CM

## 2022-08-02 MED ORDER — MELOXICAM 7.5 MG PO TABS: 7.5000 mg | ORAL_TABLET | Freq: Every day | ORAL | 0 refills | Status: DC

## 2022-08-02 NOTE — Progress Notes (Signed)
NEW PROBLEM//OFFICE VISIT   Chief Complaint  Patient presents with   Foot Pain    Left heel pain   This is a 56 year old female presents with pain in the posterior aspect of her left heel.  It is only present when she is walking when she dorsiflexes the foot.  She denies any trauma.  She does not have any plantar pain and no previous surgeries have been done     No Known Allergies  Current Outpatient Medications  Medication Instructions   ergocalciferol (VITAMIN D2) 50,000 Units, Oral, Weekly, One capsule once weekly    Review of Systems  Constitutional:  Negative for chills and fever.  Skin: Negative.   Neurological:  Negative for tingling.     BP 116/68   Pulse 70   Ht '5\' 5"'$  (1.651 m)   Wt 172 lb (78 kg)   BMI 28.62 kg/m   Body mass index is 28.62 kg/m.  General appearance: Well-developed well-nourished no gross deformities  Cardiovascular normal pulse and perfusion normal color without edema  Neurologically no sensation loss or deficits or pathologic reflexes  Psychological: Awake alert and oriented x3 mood and affect normal  Skin no lacerations or ulcerations no nodularity no palpable masses, no erythema or nodularity  Musculoskeletal:  Gait no disturbance  She is tender at the Achilles tendon insertion the tendon itself is nontender there is no swelling of the bursa or around the tendon and the tendon has no nodularity.  The dorsal and plantarflexion is normal ankle is stable there was some discomfort with dorsiflexion of the foot    Past Medical History:  Diagnosis Date   Anemia    Anxiety 05/21/2017   At risk for infectious disease due to recent foreign travel 07/28/2018   pt traveled to [place]   Burning with urination 08/17/2014   BV (bacterial vaginosis) 01/21/2014   Fibroid 02/09/2017   Lyme disease    [facility] spotted fever    Vaginal discharge 01/21/2014   Vaginal itching 11/03/2015    Past Surgical History:  Procedure Laterality Date    CESAREAN SECTION     COLONOSCOPY N/A 09/27/2017   Procedure: COLONOSCOPY;  Surgeon: Rogene Houston, MD;  Location: [facility];  Service: Endoscopy;  Laterality: N/A;   ESOPHAGOGASTRODUODENOSCOPY N/A 09/27/2017   Procedure: ESOPHAGOGASTRODUODENOSCOPY (EGD);  Surgeon: Rogene Houston, MD;  Location: [facility];  Service: Endoscopy;  Laterality: N/A;  100-rescheduled to 09/27/17 @ 8:30am per Lelon Frohlich    TUBAL LIGATION      Family History  Problem Relation Age of Onset   Hypertension Mother    Hypertension Father    Cancer Father    Alzheimer's disease Maternal Grandmother    Cancer Maternal Grandfather        colon   Colon cancer Maternal Grandfather    Cancer Paternal Grandfather        colon   Colon cancer Paternal Grandfather    Social History   Tobacco Use   Smoking status: Never   Smokeless tobacco: Never  Vaping Use   Vaping Use: Never used  Substance Use Topics   Alcohol use: No   Drug use: No    No Known Allergies  Current Meds  Medication Sig   ergocalciferol (VITAMIN D2) 1.25 MG (50000 UT) capsule Take 1 capsule (50,000 Units total) by mouth once a week. One capsule once weekly     MEDICAL DECISION MAKING  A.  Encounter Diagnosis  Name Primary?   Pain in  left ankle and joints of left foot Yes    B. DATA ANALYSED:   IMAGING: I ordered an x-ray and I read it.  There is calcaneal spur and calcification foot alignment is normal    C. MANAGEMENT   Recommend nonoperative management at this point.  We should use a course of 6 weeks of anti-inflammatories, cryotherapy and stretching, heel lift would also be beneficial.  No orders of the defined types were placed in this encounter.    Arther Abbott, MD  08/02/2022 11:53 AM

## 2022-08-14 ENCOUNTER — Other Ambulatory Visit: Payer: Self-pay | Admitting: Orthopedic Surgery

## 2022-08-14 DIAGNOSIS — M7662 Achilles tendinitis, left leg: Secondary | ICD-10-CM

## 2022-08-17 ENCOUNTER — Ambulatory Visit: Payer: 59 | Admitting: Family Medicine

## 2022-08-30 ENCOUNTER — Encounter: Payer: Self-pay | Admitting: Family Medicine

## 2022-08-30 ENCOUNTER — Ambulatory Visit: Payer: 59 | Admitting: Family Medicine

## 2022-09-13 ENCOUNTER — Ambulatory Visit: Payer: 59 | Admitting: Orthopedic Surgery

## 2022-09-13 DIAGNOSIS — M7662 Achilles tendinitis, left leg: Secondary | ICD-10-CM

## 2022-09-13 DIAGNOSIS — M25572 Pain in left ankle and joints of left foot: Secondary | ICD-10-CM

## 2022-09-13 NOTE — Progress Notes (Signed)
FOLLOW UP   Encounter Diagnoses  Name Primary?   Pain in left ankle and joints of left foot Yes   Achilles tendinitis, left leg      Chief Complaint  Patient presents with   Foot Pain    Left - doing good now     PRIOR TREATMENT: 08/02/22  Recommend nonoperative management at this point. We should use a course of 6 weeks of anti-inflammatories, cryotherapy and stretching, heel lift would also be beneficial.   Discomfort has improved significantly with the exercise and medication  Her range of motion is normal she has no tenderness or swelling  Recommend as needed follow-up

## 2022-10-26 ENCOUNTER — Encounter: Payer: Self-pay | Admitting: Radiology

## 2023-02-16 ENCOUNTER — Other Ambulatory Visit (INDEPENDENT_AMBULATORY_CARE_PROVIDER_SITE_OTHER): Payer: 59

## 2023-02-16 ENCOUNTER — Ambulatory Visit: Payer: 59 | Admitting: Orthopedic Surgery

## 2023-02-16 ENCOUNTER — Encounter: Payer: Self-pay | Admitting: Orthopedic Surgery

## 2023-02-16 VITALS — BP 122/77 | HR 68 | Ht 65.0 in | Wt 179.0 lb

## 2023-02-16 DIAGNOSIS — G8929 Other chronic pain: Secondary | ICD-10-CM

## 2023-02-16 DIAGNOSIS — M25462 Effusion, left knee: Secondary | ICD-10-CM | POA: Diagnosis not present

## 2023-02-16 DIAGNOSIS — M25562 Pain in left knee: Secondary | ICD-10-CM

## 2023-02-16 MED ORDER — METHYLPREDNISOLONE ACETATE 40 MG/ML IJ SUSP
40.0000 mg | Freq: Once | INTRAMUSCULAR | Status: AC
  Administered 2023-02-16: 40 mg via INTRA_ARTICULAR

## 2023-02-16 NOTE — Patient Instructions (Signed)
Ace wrap for 4 to 8 hours you can remove for shower  Start ibuprofen 400 to 600 mg twice a day for the next 2 weeks  When you come back we will recheck the knee and see if we need to do an MRI  You have received an injection of steroids into the joint. 15% of patients will have increased pain within the 24 hours postinjection.   This is transient and will go away.   We recommend that you use ice packs on the injection site for 20 minutes every 2 hours and extra strength Tylenol 2 tablets every 8 as needed until the pain resolves.  If you continue to have pain after taking the Tylenol and using the ice please call the office for further instructions.

## 2023-02-16 NOTE — Progress Notes (Signed)
Chief Complaint  Patient presents with   Knee Pain    Left knee pain had a fall 1 month ago no x rays pain with pressure and stairs can not have  leg completely straight   This is a 57 year old female who fell about a month ago when she saw a snake.  She actually fell twice running from the snake  About a week later she felt pain and swelling in her left knee she cannot quite climb stairs sequentially.  She does not feel like the knee will give way but she cannot fully extend it or fully flex that she does have some pain intermittently in the knee especially with those maneuvers  She presents for evaluation and management she did try some Tylenol and ibuprofen  Review of systems no chest pain shortness of breath or fever  Physical Exam Vitals and nursing note reviewed.  Constitutional:      Appearance: Normal appearance.  HENT:     Head: Normocephalic and atraumatic.  Eyes:     General: No scleral icterus.       Right eye: No discharge.        Left eye: No discharge.     Extraocular Movements: Extraocular movements intact.     Conjunctiva/sclera: Conjunctivae normal.     Pupils: Pupils are equal, round, and reactive to light.  Cardiovascular:     Rate and Rhythm: Normal rate.     Pulses: Normal pulses.  Musculoskeletal:     Comments: Left knee  No real areas of tenderness on the joint lines but she does have an effusion  She does have increased pain in the knee when she goes into figure-of-four position  All ligaments were stressed and are stable  The screw home test for meniscal pathology was positive McMurray's was not although there was some clicking and popping laterally with the McMurray's maneuver  Skin warm dry intact no rash no erythema  Skin:    General: Skin is warm and dry.     Capillary Refill: Capillary refill takes less than 2 seconds.  Neurological:     General: No focal deficit present.     Mental Status: She is alert and oriented to person, place, and  time.  Psychiatric:        Mood and Affect: Mood normal.        Behavior: Behavior normal.        Thought Content: Thought content normal.        Judgment: Judgment normal.    Imaging Plain films grade 1 arthritis both AP views of the knee mild lateral patellar overhang but no degenerative changes in the patellofemoral joint  Differential diagnosis  Encounter Diagnoses  Name Primary?   Acute pain of left knee Yes   Effusion, left knee     Meniscal tear, knee contusion, posttraumatic joint effusion  The patient consented to aspiration injection left knee  She will continue ibuprofen twice a day for the next week use an Ace bandage for 48 hours ice it every day and come back in 2 weeks for reevaluation and possible scheduling of MRI  Procedure note injection and aspiration left knee joint  Verbal consent was obtained to aspirate and inject the left knee joint   Timeout was completed to confirm the site of aspiration and injection  An 18-gauge needle was used to aspirate the left knee joint from a suprapatellar lateral approach.  The medications used were 40 mg of Depo-Medrol and 1% lidocaine 3  cc  Anesthesia was provided by ethyl chloride and the skin was prepped with alcohol.  After cleaning the skin with alcohol an 18-gauge needle was used to aspirate the right knee joint.  We obtained 15 cc of fluid CLR, YEL  We followed this by injection of 40 mg of Depo-Medrol and 3 cc 1% lidocaine.  There were no complications. A sterile bandage was applied.

## 2023-03-02 ENCOUNTER — Ambulatory Visit: Payer: 59 | Admitting: Orthopedic Surgery

## 2023-03-02 ENCOUNTER — Encounter: Payer: Self-pay | Admitting: Orthopedic Surgery

## 2023-03-02 VITALS — BP 106/70 | HR 66 | Ht 65.0 in | Wt 179.0 lb

## 2023-03-02 DIAGNOSIS — M25562 Pain in left knee: Secondary | ICD-10-CM | POA: Diagnosis not present

## 2023-03-02 NOTE — Progress Notes (Signed)
Chief Complaint  Patient presents with   Knee Pain    Left Dawn [name] Main better     02/16/23: This is a 57 year old female who fell about a month ago when she saw a snake.  She actually fell twice running from a snake   About a week later she felt pain and swelling in her left knee she cannot quite climb stairs sequentially.  She does not feel like the knee will give way but she cannot fully extend it or fully flex that she does have some pain intermittently in the knee especially with those maneuvers  We gave her an injection in the knee after drawing 15 cc of clear yellow fluid.  Today:   S- "my knee feels better", she has returned to all normal activities including climbing the stairs reciprocal fashion  O- left knee no effusion; normal ROM, w/ neg McMurrays   Encounter Diagnosis  Name Primary?   Acute pain of left knee Yes   Plan continue all normal activities follow-up as needed

## 2023-05-21 ENCOUNTER — Encounter: Payer: Self-pay | Admitting: Orthopedic Surgery

## 2023-05-21 ENCOUNTER — Ambulatory Visit: Payer: 59 | Admitting: Orthopedic Surgery

## 2023-05-21 VITALS — BP 119/77 | HR 65 | Ht 65.0 in | Wt 180.0 lb

## 2023-05-21 DIAGNOSIS — M25462 Effusion, left knee: Secondary | ICD-10-CM | POA: Diagnosis not present

## 2023-05-21 DIAGNOSIS — M25562 Pain in left knee: Secondary | ICD-10-CM | POA: Diagnosis not present

## 2023-05-21 NOTE — Progress Notes (Signed)
Recheck left knee  Chief Complaint  Patient presents with   Knee Pain    Was better with left knee now pain has returned     Initial presentation June 2024: This is a 57 year old female who fell about a month ago when she saw a snake.  She actually fell twice running from the snake   About a week later she felt pain and swelling in her left knee she cannot quite climb stairs sequentially.  She does not feel like the knee will give way but she cannot fully extend it or fully flex that she does have some pain intermittently in the knee especially with those maneuvers  Presents today with recurrent symptoms of lateral pain swelling difficulty climbing stairs without instability  Exam of the left knee shows a moderate effusion pain at terminal flexion lateral joint line tenderness and positive McMurray's sign for meniscal tear Range of motion 0 to 125 degrees  Lateral meniscal tear is a diagnosis  Recommend MRI to confirm and consider surgery if positive  Fluid not aspirated to allow for some contrast and help with diagnosis patient will take Advil and apply ice

## 2023-05-21 NOTE — Patient Instructions (Signed)
While we are working on your approval for MRI please go ahead and call to schedule your appointment with [facility] within at least one (1) week.   [facility]  [phone]

## 2023-05-29 ENCOUNTER — Telehealth: Payer: Self-pay | Admitting: Orthopedic Surgery

## 2023-05-29 NOTE — Telephone Encounter (Signed)
Returned the patient's call, she was wanting to scheduled her MRI f/u.  Advised that we have to have the CD in hand prior to scheduling.  Patient voiced understanding.

## 2023-06-08 ENCOUNTER — Ambulatory Visit: Payer: 59 | Admitting: Orthopedic Surgery

## 2024-05-19 ENCOUNTER — Other Ambulatory Visit: Payer: Self-pay | Admitting: Orthopedic Surgery

## 2024-05-19 DIAGNOSIS — M25562 Pain in left knee: Secondary | ICD-10-CM

## 2024-05-19 DIAGNOSIS — M25462 Effusion, left knee: Secondary | ICD-10-CM

## 2024-06-03 ENCOUNTER — Encounter: Payer: Self-pay | Admitting: Orthopedic Surgery

## 2024-08-01 ENCOUNTER — Ambulatory Visit: Admitting: Family Medicine

## 2024-08-01 ENCOUNTER — Encounter: Payer: Self-pay | Admitting: Family Medicine

## 2024-08-01 VITALS — BP 125/75 | HR 76 | Resp 18 | Ht 65.0 in | Wt 166.0 lb

## 2024-08-01 DIAGNOSIS — N951 Menopausal and female climacteric states: Secondary | ICD-10-CM

## 2024-08-01 DIAGNOSIS — Z1211 Encounter for screening for malignant neoplasm of colon: Secondary | ICD-10-CM

## 2024-08-01 DIAGNOSIS — Z2820 Immunization not carried out because of patient decision for unspecified reason: Secondary | ICD-10-CM | POA: Diagnosis not present

## 2024-08-01 DIAGNOSIS — R928 Other abnormal and inconclusive findings on diagnostic imaging of breast: Secondary | ICD-10-CM

## 2024-08-01 DIAGNOSIS — Z1231 Encounter for screening mammogram for malignant neoplasm of breast: Secondary | ICD-10-CM

## 2024-08-01 DIAGNOSIS — Z0000 Encounter for general adult medical examination without abnormal findings: Secondary | ICD-10-CM

## 2024-08-01 DIAGNOSIS — D539 Nutritional anemia, unspecified: Secondary | ICD-10-CM

## 2024-08-01 DIAGNOSIS — E559 Vitamin D deficiency, unspecified: Secondary | ICD-10-CM

## 2024-08-01 MED ORDER — LYDIA PINKHAM PO TABS: ORAL_TABLET | ORAL | 3 refills | Status: AC

## 2024-08-01 NOTE — Patient Instructions (Addendum)
 Annual with pap 05/2025 with Gyne   Nurse pls order bilat diag mammogram and right breast uS  and schedule at checkout  Nurse pls provide cologuard info and order  Fasting cBC, lipid, cmp and EGGFR, TSH and vit d next week please   Dawn [name] is prescribed for hot flashes, black cohosh is another alternative   Regular exercise , cold drinks, fan and  minimal clothing will help hot flashes   Reconsider vaccines  No sound when asleep or light improves sleep , set TV to turn off   Thanks for choosing [facility], we consider it a privelige to serve you.

## 2024-08-01 NOTE — Progress Notes (Unsigned)
         Dawn [name]     MRN: [id number]      DOB: 09/24/1965  Chief Complaint  Patient presents with   Annual Exam    HPI: Patient is in for annual physical exam. C/o excess nigh sweats , disturbing sleep, she is 2 years post menopause, has had no bleeding since Mammogram is past due and diagnostic needed, importance of follow through is stressed Immunization is reviewed , and  she declines ALL vaccines   PE: BP 125/75   Pulse 76   Resp 18   Ht 5' 5 (1.651 m)   Wt 166 lb 0.6 oz (75.3 kg)   SpO2 98%   BMI 27.63 kg/m   Pleasant  female, alert and oriented x 3, in no cardio-pulmonary distress. Afebrile. HEENT No facial trauma or asymetry. Sinuses non tender.  Extra occullar muscles intact.. External ears normal, . Neck: supple, no adenopathy,JVD or thyromegaly.No bruits.  Chest: Clear to ascultation bilaterally.No crackles or wheezes. Non tender to palpation  Breast: No examined, asymptomatic , but does not do breast exams. Abnormal mammogram in 2023, repeat imaging past due   Cardiovascular system; Heart sounds normal,  S1 and  S2 ,no S3.  No murmur, or thrill. Apical beat not displaced Peripheral pulses normal.  Abdomen: Soft, non tender   GU: Asymptomatic, not examined  Musculoskeletal exam: Full ROM of spine, hips , shoulders and knees. No deformity ,swelling or crepitus noted. No muscle wasting or atrophy.   Neurologic: Cranial nerves 2 to 12 intact. Power, tone ,sensation and reflexes normal throughout. No disturbance in gait. No tremor.  Skin: Intact, no ulceration, erythema , scaling or rash noted. Pigmentation normal throughout  Psych; Normal mood and affect. Judgement and concentration normal   Assessment & Plan:  Encounter for annual health examination Annual exam as documented. Counseling done  re healthy lifestyle involving commitment to 150 minutes exercise per week, heart healthy diet, and attaining healthy weight.The  importance of adequate sleep also discussed. Immunization and cancer screening needs are specifically addressed at this visit.   Menopausal symptoms Reports excess night sweats, lydia pinkham or black cohosh recommended, no interest in prescription meds, lifestyle modification, exercise, cool drinks, ceiling fan and  dressing minimally  Breast mass, right No follow up done by pt, scheduling requested at time of checkout with close follow up  Immunization not carried out because of patient decision Declines all vaccines re educated

## 2024-08-03 ENCOUNTER — Encounter: Payer: Self-pay | Admitting: Family Medicine

## 2024-08-03 DIAGNOSIS — Z2820 Immunization not carried out because of patient decision for unspecified reason: Secondary | ICD-10-CM | POA: Insufficient documentation

## 2024-08-03 DIAGNOSIS — Z0000 Encounter for general adult medical examination without abnormal findings: Secondary | ICD-10-CM | POA: Insufficient documentation

## 2024-08-03 NOTE — Assessment & Plan Note (Signed)
 Annual exam as documented. Counseling done  re healthy lifestyle involving commitment to 150 minutes exercise per week, heart healthy diet, and attaining healthy weight.The importance of adequate sleep also discussed.  Immunization and cancer screening needs are specifically addressed at this visit.

## 2024-08-03 NOTE — Assessment & Plan Note (Signed)
 Declines all vaccines re educated

## 2024-08-03 NOTE — Assessment & Plan Note (Signed)
 Reports excess night sweats, lydia pinkham or black cohosh recommended, no interest in prescription meds, lifestyle modification, exercise, cool drinks, ceiling fan and  dressing minimally

## 2024-08-03 NOTE — Assessment & Plan Note (Signed)
 No follow up done by pt, scheduling requested at time of checkout with close follow up

## 2024-08-06 ENCOUNTER — Other Ambulatory Visit (HOSPITAL_COMMUNITY)
Admission: RE | Admit: 2024-08-06 | Discharge: 2024-08-06 | Disposition: A | Discharge status: Home / Self Care | Source: Ambulatory Visit | Attending: Family Medicine | Admitting: Family Medicine

## 2024-08-06 ENCOUNTER — Telehealth: Payer: Self-pay | Admitting: Family Medicine

## 2024-08-06 ENCOUNTER — Ambulatory Visit: Payer: Self-pay | Admitting: Family Medicine

## 2024-08-06 DIAGNOSIS — D539 Nutritional anemia, unspecified: Secondary | ICD-10-CM | POA: Insufficient documentation

## 2024-08-06 DIAGNOSIS — Z0000 Encounter for general adult medical examination without abnormal findings: Secondary | ICD-10-CM | POA: Insufficient documentation

## 2024-08-06 DIAGNOSIS — E559 Vitamin D deficiency, unspecified: Secondary | ICD-10-CM | POA: Insufficient documentation

## 2024-08-06 LAB — COMPREHENSIVE METABOLIC PANEL WITH GFR
ALT: 15 U/L (ref 0–44)
AST: 19 U/L (ref 15–41)
Albumin: 4.6 g/dL (ref 3.5–5.0)
Alkaline Phosphatase: 65 U/L (ref 38–126)
Anion gap: 13 (ref 5–15)
BUN: 16 mg/dL (ref 6–20)
CO2: 27 mmol/L (ref 22–32)
Calcium: 9.6 mg/dL (ref 8.9–10.3)
Chloride: 102 mmol/L (ref 98–111)
Creatinine, Ser: 0.72 mg/dL (ref 0.44–1.00)
GFR, Estimated: >60 mL/min (ref >60)
Glucose, Bld: 89 mg/dL (ref 70–99)
Potassium: 3.8 mmol/L (ref 3.5–5.1)
Sodium: 141 mmol/L (ref 135–145)
Total Bilirubin: 1.2 mg/dL (ref 0.0–1.2)
Total Protein: 8.3 g/dL — ABNORMAL HIGH (ref 6.5–8.1)

## 2024-08-06 LAB — CBC WITH DIFFERENTIAL/PLATELET
Abs Immature Granulocytes: 0.01 K/uL (ref 0.00–0.07)
Basophils Absolute: 0.1 K/uL (ref 0.0–0.1)
Basophils Relative: 2 %
Eosinophils Absolute: 0 K/uL (ref 0.0–0.5)
Eosinophils Relative: 1 %
HCT: 42.9 % (ref 36.0–46.0)
Hemoglobin: 13 g/dL (ref 12.0–15.0)
Immature Granulocytes: 0 %
Lymphocytes Relative: 34 %
Lymphs Abs: 1.4 K/uL (ref 0.7–4.0)
MCH: 21.4 pg — ABNORMAL LOW (ref 26.0–34.0)
MCHC: 30.3 g/dL (ref 30.0–36.0)
MCV: 70.6 fL — ABNORMAL LOW (ref 80.0–100.0)
Monocytes Absolute: 0.5 K/uL (ref 0.1–1.0)
Monocytes Relative: 11 %
Neutro Abs: 2.1 K/uL (ref 1.7–7.7)
Neutrophils Relative %: 52 %
Platelets: 206 K/uL (ref 150–400)
RBC: 6.08 MIL/uL — ABNORMAL HIGH (ref 3.87–5.11)
RDW: 18.5 % — ABNORMAL HIGH (ref 11.5–15.5)
WBC: 4.1 K/uL (ref 4.0–10.5)
nRBC: 0 % (ref 0.0–0.2)

## 2024-08-06 LAB — LIPID PANEL
Cholesterol: 245 mg/dL — ABNORMAL HIGH (ref 0–200)
HDL: 94 mg/dL (ref >40)
LDL Cholesterol: 138 mg/dL — ABNORMAL HIGH (ref 0–99)
Total CHOL/HDL Ratio: 2.6 ratio
Triglycerides: 62 mg/dL (ref <150)
VLDL: 12 mg/dL (ref 0–40)

## 2024-08-06 LAB — VITAMIN D 25 HYDROXY (VIT D DEFICIENCY, FRACTURES): Vit D, 25-Hydroxy: 28.14 ng/mL — ABNORMAL LOW (ref 30–100)

## 2024-08-06 LAB — TSH: TSH: 1.35 u[IU]/mL (ref 0.350–4.500)

## 2024-08-06 NOTE — Telephone Encounter (Signed)
 Pt called in stating her pharmacy does not have access to [facility] and is unsure as to where to direct her to get it, let her fet the black kohosh instead that we spoke of to use for her hot flashes, this also is OTC, y thanks

## 2024-08-07 ENCOUNTER — Telehealth: Payer: Self-pay

## 2024-08-07 NOTE — Telephone Encounter (Signed)
 Copied from CRM #[id number]. Topic: Clinical - Lab/Test Results >> Aug 07, 2024  9:21 AM Donna BRAVO wrote: Reason for CRM: Patient calling for lab results Read Note Verbatim:   Rollene BRAVO P[place], MD 08/06/2024  4:27 PM EST Back to Top  Labs are good overall Normal thyroid  function, not anemic, normal kidney and liver function Needs OTC vitamin D3 5000 units daily as a supplement Cholesterol total and bad are too high, needs to reduce fried and fatty foods   Patient has no questions regarding lab results at this time.  Patient asking where to get Jinnie Rosa  Read Note Verbatim: Raelene Stefano MATSU, CMA to SHABREKA COULON     08/07/24  8:20 AM Good Morning,   I did some research and could not find any pharmacies who have Jinnie Rosa. However, I did find it online at [facility] for $15.00 for 72 tablets. It looks like they only sell it online.    Patient asking if its ok to take vitamin D3 5000 units and  Jinnie Rosa together?  Patient would like a call back regarding taking medication together .  Patient was informed they will receive a call back by end of business day.

## 2024-08-07 NOTE — Telephone Encounter (Signed)
Pt informed

## 2024-08-20 LAB — COLOGUARD: COLOGUARD: NEGATIVE

## 2024-08-22 ENCOUNTER — Ambulatory Visit: Payer: Self-pay | Admitting: Family Medicine

## 2024-09-09 ENCOUNTER — Encounter (HOSPITAL_COMMUNITY): Payer: Self-pay

## 2024-09-09 ENCOUNTER — Ambulatory Visit (HOSPITAL_COMMUNITY)
Admission: RE | Admit: 2024-09-09 | Discharge: 2024-09-09 | Disposition: A | Source: Ambulatory Visit | Attending: Family Medicine | Admitting: Family Medicine

## 2024-09-09 ENCOUNTER — Ambulatory Visit (HOSPITAL_COMMUNITY)
Admission: RE | Admit: 2024-09-09 | Discharge: 2024-09-09 | Disposition: A | Discharge status: Home / Self Care | Source: Ambulatory Visit | Attending: Family Medicine | Admitting: Family Medicine

## 2024-09-09 DIAGNOSIS — Z1231 Encounter for screening mammogram for malignant neoplasm of breast: Secondary | ICD-10-CM

## 2024-09-09 DIAGNOSIS — R928 Other abnormal and inconclusive findings on diagnostic imaging of breast: Secondary | ICD-10-CM

## 2024-10-02 ENCOUNTER — Ambulatory Visit: Payer: Self-pay | Admitting: Family Medicine

## 2025-06-16 ENCOUNTER — Encounter: Admitting: Family Medicine
